# Patient Record
Sex: Female | Born: 1985 | Race: Asian | Hispanic: No | Marital: Single | Smoking: Never smoker
Health system: Southern US, Community
[De-identification: ages and names within clinical notes are randomized; demographics above are authoritative.]

## PROBLEM LIST (undated history)

## (undated) DIAGNOSIS — S62102A Fracture of unspecified carpal bone, left wrist, initial encounter for closed fracture: Secondary | ICD-10-CM

## (undated) DIAGNOSIS — K219 Gastro-esophageal reflux disease without esophagitis: Secondary | ICD-10-CM

## (undated) HISTORY — DX: Fracture of unspecified carpal bone, left wrist, initial encounter for closed fracture: S62.102A

## (undated) HISTORY — PX: UPPER GI ENDOSCOPY: SHX6162

---
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
## 2016-03-19 ENCOUNTER — Ambulatory Visit (INDEPENDENT_AMBULATORY_CARE_PROVIDER_SITE_OTHER): Payer: 59

## 2016-03-19 ENCOUNTER — Ambulatory Visit (INDEPENDENT_AMBULATORY_CARE_PROVIDER_SITE_OTHER): Payer: 59 | Admitting: Physician Assistant

## 2016-03-19 VITALS — BP 140/88 | HR 68 | Temp 98.2°F | Resp 16 | Ht 63.5 in | Wt 166.2 lb

## 2016-03-19 DIAGNOSIS — Z6828 Body mass index (BMI) 28.0-28.9, adult: Secondary | ICD-10-CM | POA: Insufficient documentation

## 2016-03-19 DIAGNOSIS — R071 Chest pain on breathing: Secondary | ICD-10-CM

## 2016-03-19 MED ORDER — CYCLOBENZAPRINE HCL 10 MG PO TABS: 10.0000 mg | ORAL_TABLET | Freq: Three times a day (TID) | ORAL | Status: DC | PRN

## 2016-03-19 MED ORDER — MELOXICAM 15 MG PO TABS: 15.0000 mg | ORAL_TABLET | Freq: Every day | ORAL | Status: DC

## 2016-03-19 NOTE — Patient Instructions (Signed)
     IF you received an x-ray today, you will receive an invoice from [facility]. Please contact [facility] at [phone] with questions or concerns regarding your invoice.   IF you received labwork today, you will receive an invoice from [facility]. Please contact [facility] at [phone] with questions or concerns regarding your invoice.   Our billing staff will not be able to assist you with questions regarding bills from these companies.  You will be contacted with the lab results as soon as they are available. The fastest way to get your results is to activate your My Chart account. Instructions are located on the last page of this paperwork. If you have not heard from us regarding the results in 2 weeks, please contact this office.      
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
## 2017-01-25 ENCOUNTER — Other Ambulatory Visit: Payer: Self-pay | Admitting: Gastroenterology

## 2017-01-25 DIAGNOSIS — R634 Abnormal weight loss: Secondary | ICD-10-CM

## 2017-01-25 DIAGNOSIS — R1013 Epigastric pain: Secondary | ICD-10-CM

## 2017-01-27 ENCOUNTER — Ambulatory Visit
Admission: RE | Admit: 2017-01-27 | Discharge: 2017-01-27 | Disposition: A | Discharge status: Home / Self Care | Payer: 59 | Source: Ambulatory Visit | Attending: Gastroenterology | Admitting: Gastroenterology

## 2017-01-27 DIAGNOSIS — R634 Abnormal weight loss: Secondary | ICD-10-CM

## 2017-01-27 DIAGNOSIS — R1013 Epigastric pain: Secondary | ICD-10-CM

## 2017-04-06 ENCOUNTER — Other Ambulatory Visit: Payer: Self-pay | Admitting: Gastroenterology

## 2017-04-06 DIAGNOSIS — R1084 Generalized abdominal pain: Secondary | ICD-10-CM

## 2017-04-06 DIAGNOSIS — R634 Abnormal weight loss: Secondary | ICD-10-CM

## 2017-04-12 ENCOUNTER — Ambulatory Visit
Admission: RE | Admit: 2017-04-12 | Discharge: 2017-04-12 | Disposition: A | Discharge status: Home / Self Care | Payer: 59 | Source: Ambulatory Visit | Attending: Gastroenterology | Admitting: Gastroenterology

## 2017-04-12 DIAGNOSIS — R1084 Generalized abdominal pain: Secondary | ICD-10-CM

## 2017-04-12 DIAGNOSIS — R634 Abnormal weight loss: Secondary | ICD-10-CM

## 2017-04-12 MED ORDER — IOPAMIDOL (ISOVUE-300) INJECTION 61%
100.0000 mL | Freq: Once | INTRAVENOUS | Status: AC | PRN
  Administered 2017-04-12: 100 mL via INTRAVENOUS

## 2018-02-08 ENCOUNTER — Other Ambulatory Visit: Payer: Self-pay | Admitting: Gastroenterology

## 2018-02-08 DIAGNOSIS — K824 Cholesterolosis of gallbladder: Secondary | ICD-10-CM

## 2018-02-18 ENCOUNTER — Ambulatory Visit
Admission: RE | Admit: 2018-02-18 | Discharge: 2018-02-18 | Disposition: A | Discharge status: Home / Self Care | Payer: 59 | Source: Ambulatory Visit | Attending: Gastroenterology | Admitting: Gastroenterology

## 2018-02-18 DIAGNOSIS — K824 Cholesterolosis of gallbladder: Secondary | ICD-10-CM

## 2018-12-07 IMAGING — US US ABDOMEN LIMITED
1 series · 14 of 25 positions shown · non-contrast
Comparison: CT scan of April 12, 2017.  Ultrasound January 27, 2017.

CLINICAL DATA: Gallbladder polyp.

EXAM:
ULTRASOUND ABDOMEN LIMITED RIGHT UPPER QUADRANT

[Series 1: us abdomen limited · 0.23mm/px · 14 of 49 slices shown]
[im 1/49]
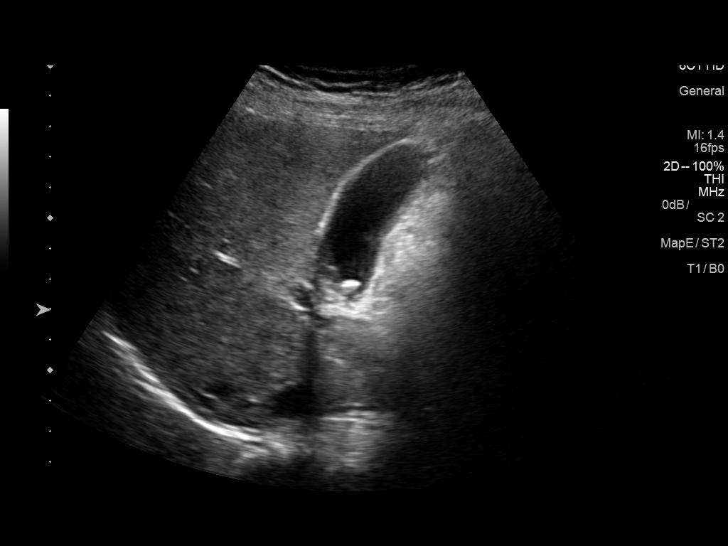
[im 5/49]
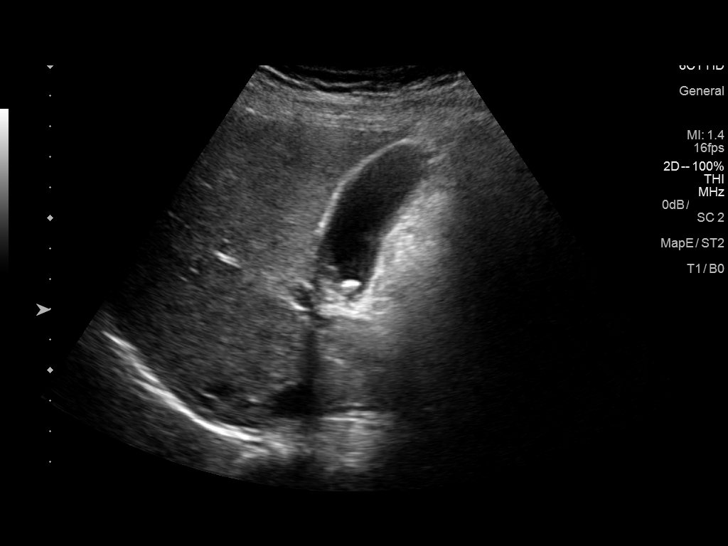
[im 9/49]
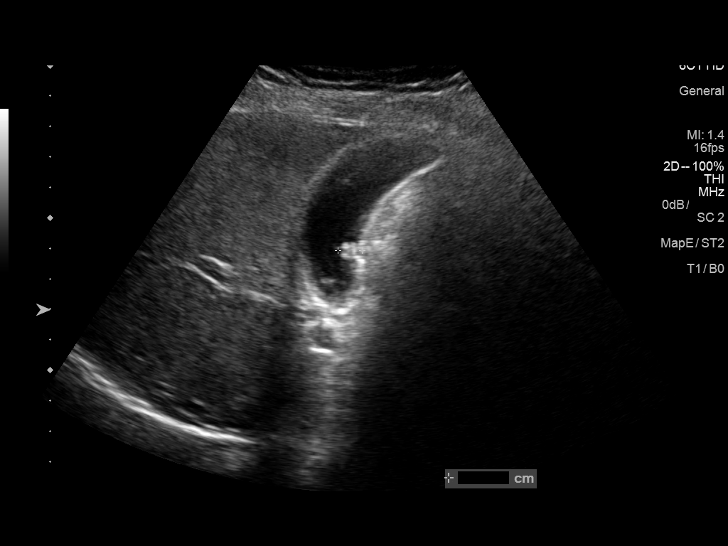
[im 13/49]
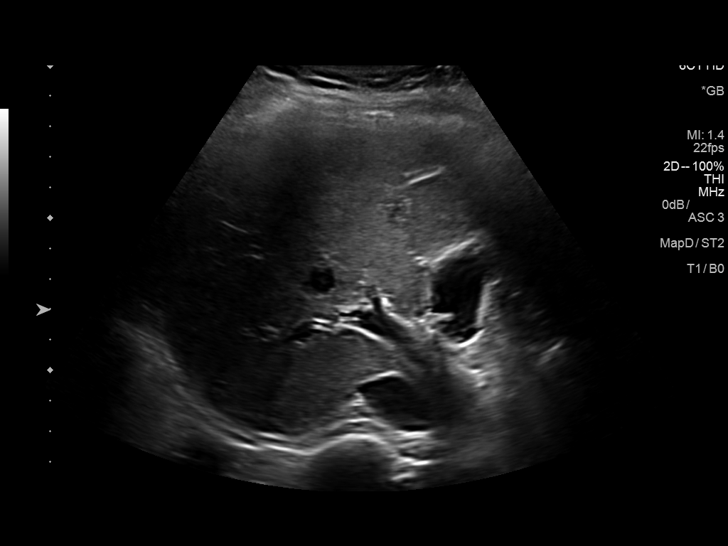
[im 17/49]
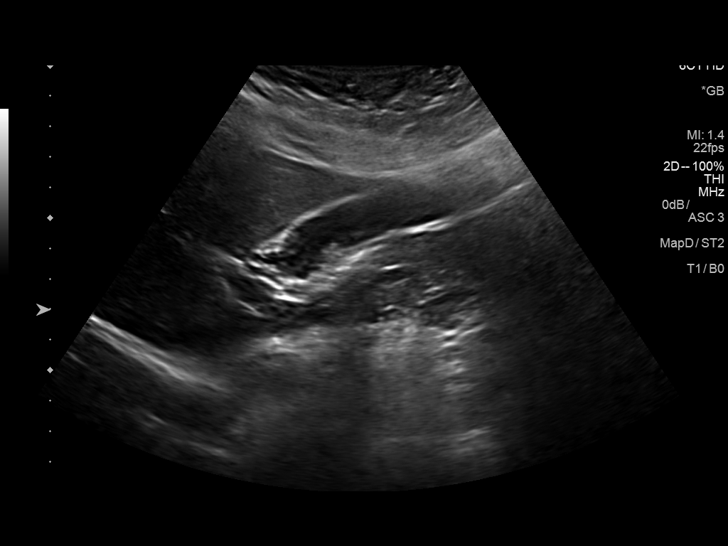
[im 19/49]
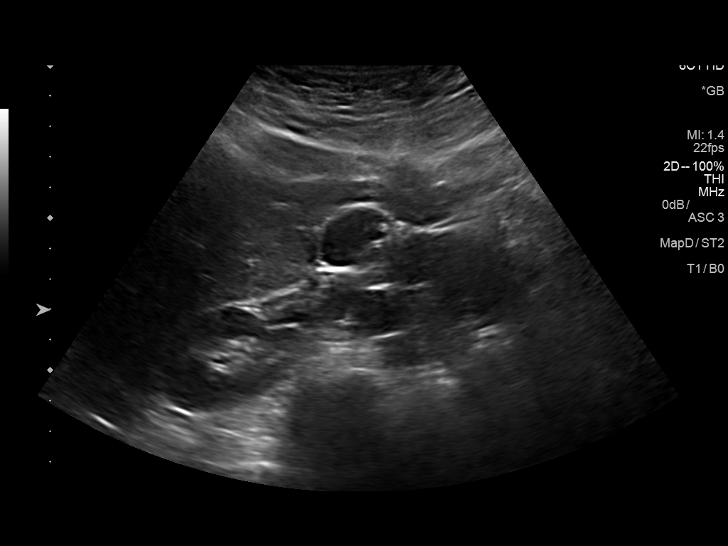
[im 23/49]
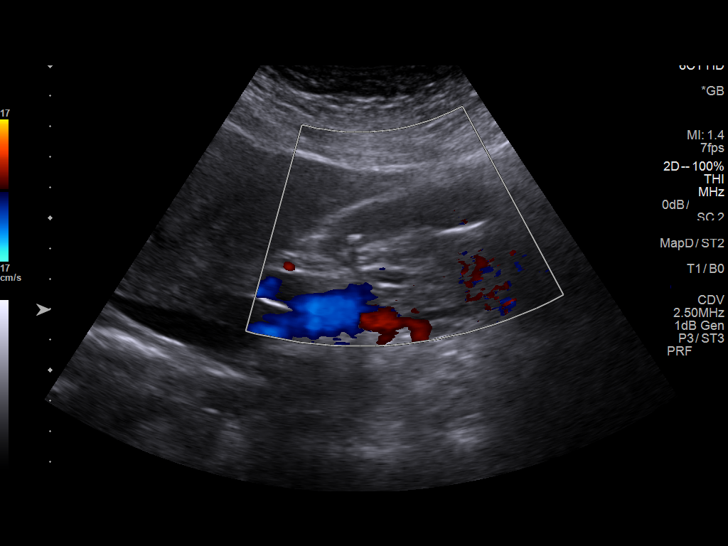
[im 27/49]
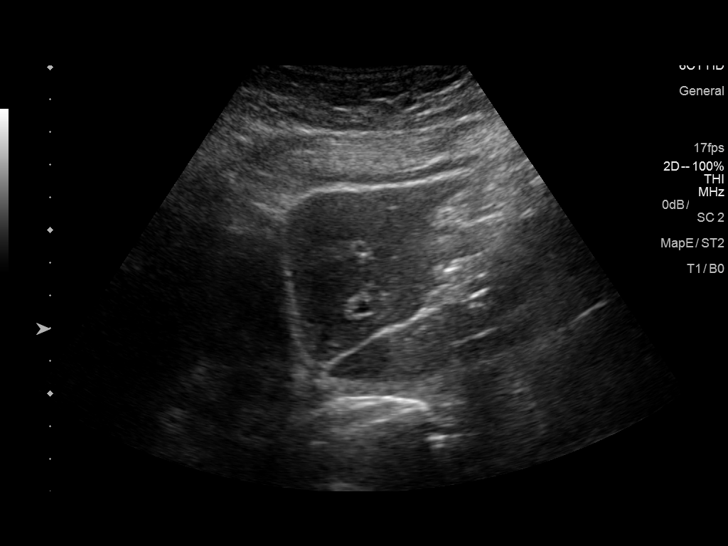
[im 31/49]
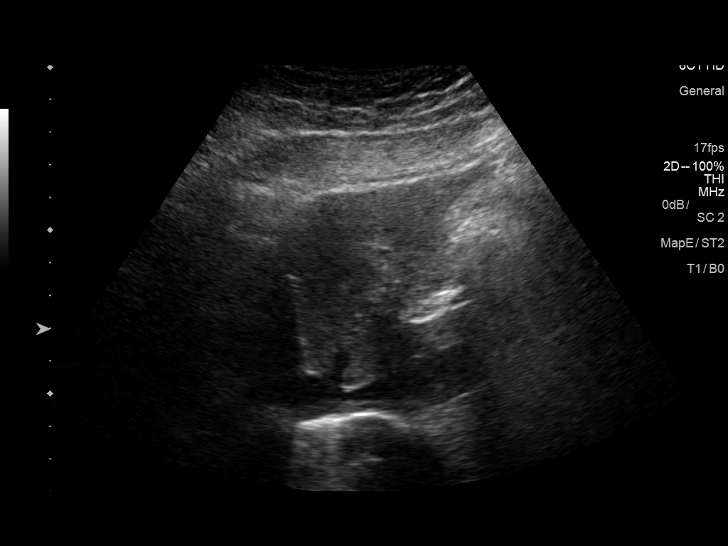
[im 33/49]
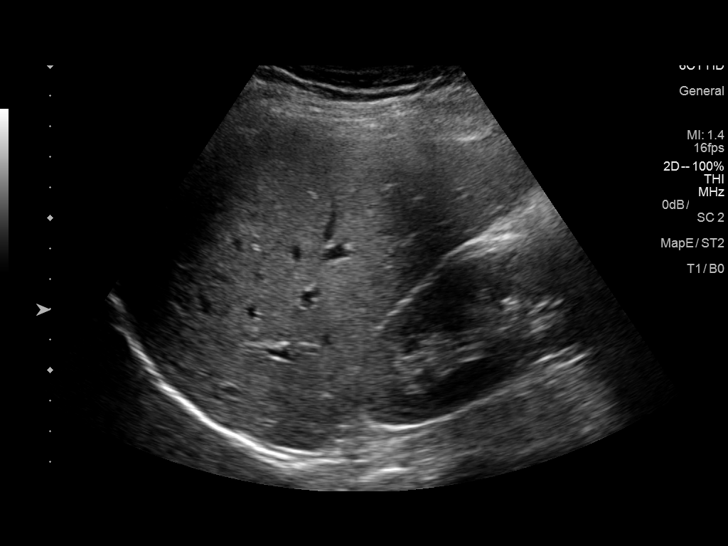
[im 37/49]
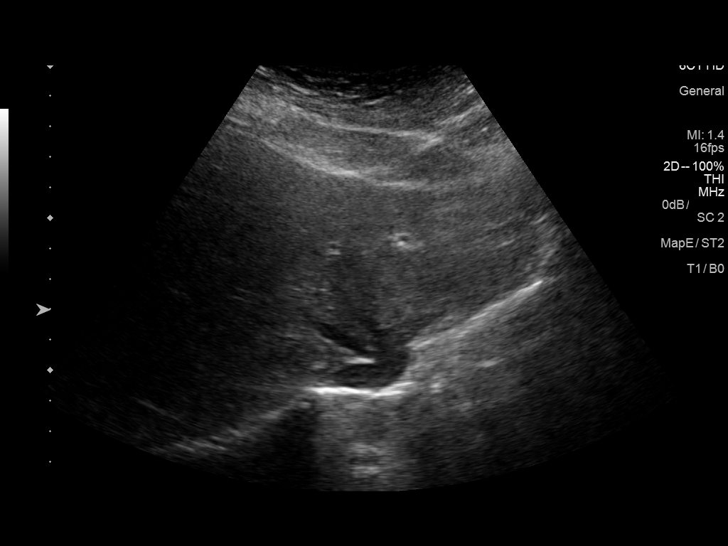
[im 41/49]
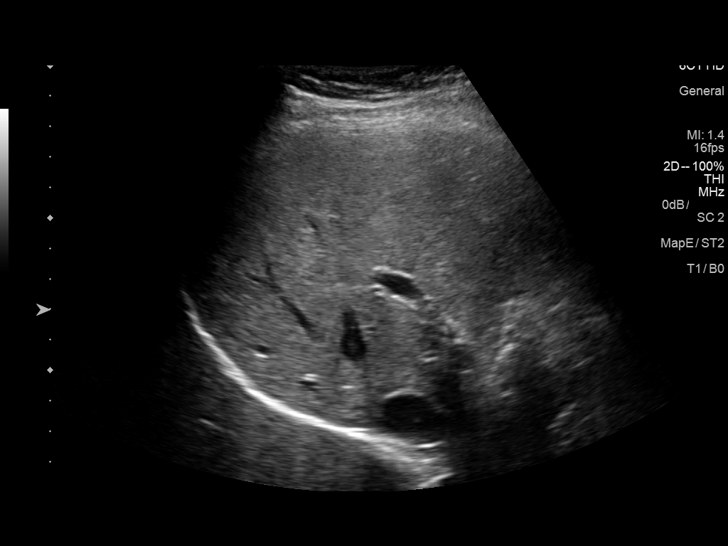
[im 45/49]
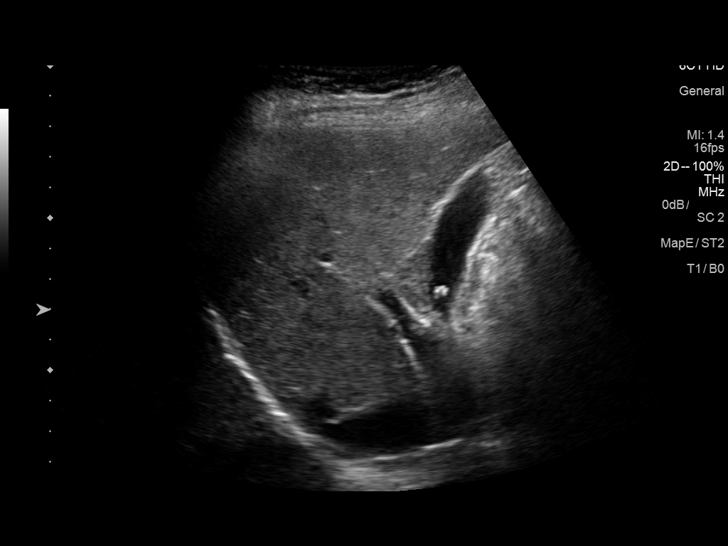
[im 49/49]
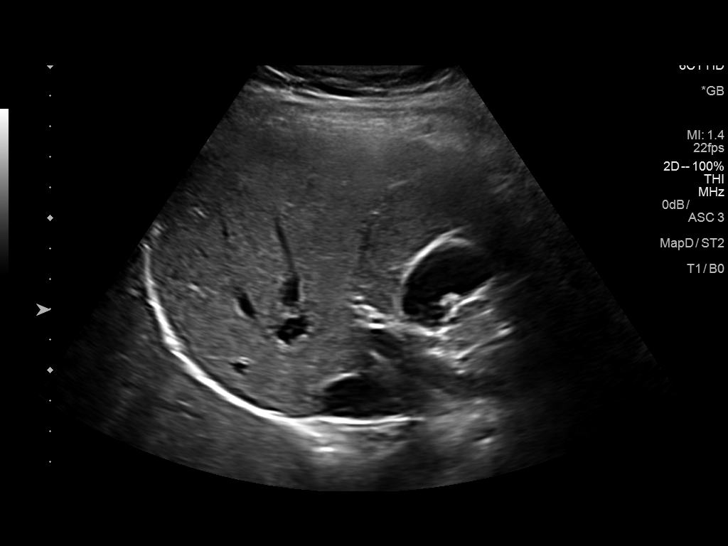

[14 of 25 positions shown; findings below may reference images not displayed]

FINDINGS: Gallbladder:

Multiple gallstones are noted with the largest measuring 7 mm. No
gallbladder wall thickening or pericholecystic fluid is noted. No
sonographic Murphy's sign is noted. 8 mm gallbladder polyp is noted
which is not significantly changed compared to prior exam.

Common bile duct:

Diameter: 5.5 mm which is within normal limits.

Liver:

No focal lesion identified. Within normal limits in parenchymal
echogenicity. Portal vein is patent on color Doppler imaging with
normal direction of blood flow towards the liver.
IMPRESSION: Cholelithiasis without evidence of cholecystitis.

8 mm gallbladder polyp is noted which is not significantly changed
compared to prior exam. Follow-up ultrasound in 1 year is
recommended to ensure stability.

## 2019-10-30 ENCOUNTER — Other Ambulatory Visit: Payer: Self-pay | Admitting: Gastroenterology

## 2019-10-30 DIAGNOSIS — R634 Abnormal weight loss: Secondary | ICD-10-CM

## 2019-10-30 DIAGNOSIS — R1013 Epigastric pain: Secondary | ICD-10-CM

## 2019-11-03 ENCOUNTER — Ambulatory Visit
Admission: RE | Admit: 2019-11-03 | Discharge: 2019-11-03 | Disposition: A | Discharge status: Home / Self Care | Payer: 59 | Source: Ambulatory Visit | Attending: Gastroenterology | Admitting: Gastroenterology

## 2019-11-03 DIAGNOSIS — R1013 Epigastric pain: Secondary | ICD-10-CM

## 2019-11-03 DIAGNOSIS — R634 Abnormal weight loss: Secondary | ICD-10-CM

## 2019-12-11 ENCOUNTER — Ambulatory Visit: Payer: Self-pay | Admitting: General Surgery

## 2019-12-12 NOTE — Pre-Procedure Instructions (Signed)
[id number] - [place], [place] - [address] AT [facility] OF [address] [place] [address] Phone: [phone] Fax: [phone]    Your procedure is scheduled on Tues., December 19, 2019 from 8:45AM-9:45AM  Report to [facility] Entrance at 6:45AM  Call this number if you have problems the morning of surgery:  [phone]   Remember:  Do not eat or drink after midnight on March 29th    Take these medicines the morning of surgery with A SIP OF WATER: NONE  As of today, stop taking all Aspirin (unless instructed by your doctor) and Other Aspirin containing products, Vitamins, Fish oils, and Herbal medications. Also stop all NSAIDS i.e. Advil, Ibuprofen, Motrin, Aleve, Anaprox, Naproxen, BC, Goody Powders, and all Supplements.  No Smoking of any kind, Tobacco, or Alcohol products 24 hours prior to your procedure. If you use a Cpap at night, you may bring all equipment for your overnight stay.    Special instructions:   [facility]- Preparing For Surgery  Before surgery, you can play an important role. Because skin is not sterile, your skin needs to be as free of germs as possible. You can reduce the number of germs on your skin by washing with CHG (chlorahexidine gluconate) Soap before surgery.  CHG is an antiseptic cleaner which kills germs and bonds with the skin to continue killing germs even after washing.    Please do not use if you have an allergy to CHG or antibacterial soaps. If your skin becomes reddened/irritated stop using the CHG.  Do not shave (including legs and underarms) for at least 48 hours prior to first CHG shower. It is OK to shave your face.  Please follow these instructions carefully.   1. Shower the NIGHT BEFORE SURGERY and the MORNING OF SURGERY with CHG.   2. If you chose to wash your hair, wash your hair first as usual with your normal shampoo.  3. After you shampoo, rinse your hair and body thoroughly  to remove the shampoo.  4. Use CHG as you would any other liquid soap. You can apply CHG directly to the skin and wash gently with a scrungie or a clean washcloth.   5. Apply the CHG Soap to your body ONLY FROM THE NECK DOWN.  Do not use on open wounds or open sores. Avoid contact with your eyes, ears, mouth and genitals (private parts). Wash Face and genitals (private parts)  with your normal soap.  6. Wash thoroughly, paying special attention to the area where your surgery will be performed.  7. Thoroughly rinse your body with warm water from the neck down.  8. DO NOT shower/wash with your normal soap after using and rinsing off the CHG Soap.  9. Pat yourself dry with a CLEAN TOWEL.  10. Wear CLEAN PAJAMAS to bed the night before surgery, wear comfortable clothes the morning of surgery  11. Place CLEAN SHEETS on your bed the night of your first shower and DO NOT SLEEP WITH PETS.   Day of Surgery:           Remember to brush your teeth WITH YOUR REGULAR TOOTHPASTE.  Do not wear jewelry, make-up or nail polish.  Do not wear lotions, powders, or perfumes, or deodorant.  Do not shave 48 hours prior to surgery.    Do not bring valuables to the hospital.  [facility] is not responsible for any belongings or valuables.  Contacts, dentures  or bridgework may not be worn into surgery.    For patients admitted to the hospital, discharge time will be determined by your treatment team.  Patients discharged the day of surgery will not be allowed to drive home, and someone age 37 and over needs to stay with them for 24 hours.  Please wear clean clothes to the hospital/surgery center.     Please read over the following fact sheets that you were given.

## 2019-12-13 ENCOUNTER — Other Ambulatory Visit: Payer: Self-pay

## 2019-12-13 ENCOUNTER — Encounter (HOSPITAL_COMMUNITY)
Admission: RE | Admit: 2019-12-13 | Discharge: 2019-12-13 | Disposition: A | Discharge status: Home / Self Care | Payer: 59 | Source: Ambulatory Visit | Attending: General Surgery | Admitting: General Surgery

## 2019-12-13 ENCOUNTER — Encounter (HOSPITAL_COMMUNITY): Payer: Self-pay

## 2019-12-13 DIAGNOSIS — Z01812 Encounter for preprocedural laboratory examination: Secondary | ICD-10-CM | POA: Insufficient documentation

## 2019-12-13 HISTORY — DX: Gastro-esophageal reflux disease without esophagitis: K21.9

## 2019-12-13 LAB — CBC
HCT: 40.9 % (ref 36.0–46.0)
Hemoglobin: 13.1 g/dL (ref 12.0–15.0)
MCH: 29.9 pg (ref 26.0–34.0)
MCHC: 32 g/dL (ref 30.0–36.0)
MCV: 93.4 fL (ref 80.0–100.0)
Platelets: 279 10*3/uL (ref 150–400)
RBC: 4.38 MIL/uL (ref 3.87–5.11)
RDW: 11.9 % (ref 11.5–15.5)
WBC: 4.3 10*3/uL (ref 4.0–10.5)
nRBC: 0 % (ref 0.0–0.2)

## 2019-12-13 NOTE — Progress Notes (Signed)
PCP - Delbert Harness  Chest x-ray - n/a EKG - n/a  COVID TEST- 12-15-19   Anesthesia review: n/a  Patient denies shortness of breath, fever, cough and chest pain at PAT appointment   All instructions explained to the patient, with a verbal understanding of the material. Patient agrees to go over the instructions while at home for a better understanding. Patient also instructed to self quarantine after being tested for COVID-19. The opportunity to ask questions was provided.

## 2019-12-15 ENCOUNTER — Other Ambulatory Visit (HOSPITAL_COMMUNITY)
Admission: RE | Admit: 2019-12-15 | Discharge: 2019-12-15 | Disposition: A | Discharge status: Home / Self Care | Payer: 59 | Source: Ambulatory Visit | Attending: General Surgery | Admitting: General Surgery

## 2019-12-15 DIAGNOSIS — Z20822 Contact with and (suspected) exposure to covid-19: Secondary | ICD-10-CM | POA: Diagnosis not present

## 2019-12-15 DIAGNOSIS — Z01812 Encounter for preprocedural laboratory examination: Secondary | ICD-10-CM | POA: Insufficient documentation

## 2019-12-15 LAB — SARS CORONAVIRUS 2 (TAT 6-24 HRS): SARS Coronavirus 2: NEGATIVE

## 2019-12-19 ENCOUNTER — Other Ambulatory Visit: Payer: Self-pay

## 2019-12-19 ENCOUNTER — Ambulatory Visit (HOSPITAL_COMMUNITY): Payer: 59 | Admitting: Anesthesiology

## 2019-12-19 ENCOUNTER — Encounter (HOSPITAL_COMMUNITY): Payer: Self-pay | Admitting: General Surgery

## 2019-12-19 ENCOUNTER — Ambulatory Visit (HOSPITAL_COMMUNITY)
Admission: RE | Admit: 2019-12-19 | Discharge: 2019-12-19 | Disposition: A | Discharge status: Home / Self Care | Payer: 59 | Attending: General Surgery | Admitting: General Surgery

## 2019-12-19 ENCOUNTER — Encounter (HOSPITAL_COMMUNITY)
Admission: RE | Disposition: A | Discharge status: Home / Self Care | Payer: Self-pay | Source: Home / Self Care | Attending: General Surgery

## 2019-12-19 DIAGNOSIS — Z888 Allergy status to other drugs, medicaments and biological substances status: Secondary | ICD-10-CM | POA: Diagnosis not present

## 2019-12-19 DIAGNOSIS — Z8349 Family history of other endocrine, nutritional and metabolic diseases: Secondary | ICD-10-CM | POA: Insufficient documentation

## 2019-12-19 DIAGNOSIS — K824 Cholesterolosis of gallbladder: Secondary | ICD-10-CM | POA: Diagnosis present

## 2019-12-19 DIAGNOSIS — K219 Gastro-esophageal reflux disease without esophagitis: Secondary | ICD-10-CM | POA: Insufficient documentation

## 2019-12-19 DIAGNOSIS — K811 Chronic cholecystitis: Secondary | ICD-10-CM | POA: Insufficient documentation

## 2019-12-19 DIAGNOSIS — Z820 Family history of epilepsy and other diseases of the nervous system: Secondary | ICD-10-CM | POA: Diagnosis not present

## 2019-12-19 DIAGNOSIS — Z832 Family history of diseases of the blood and blood-forming organs and certain disorders involving the immune mechanism: Secondary | ICD-10-CM | POA: Insufficient documentation

## 2019-12-19 DIAGNOSIS — Z8371 Family history of colonic polyps: Secondary | ICD-10-CM | POA: Diagnosis not present

## 2019-12-19 HISTORY — PX: CHOLECYSTECTOMY: SHX55

## 2019-12-19 LAB — POCT PREGNANCY, URINE: Preg Test, Ur: NEGATIVE

## 2019-12-19 SURGERY — LAPAROSCOPIC CHOLECYSTECTOMY
Anesthesia: General

## 2019-12-19 MED ORDER — FENTANYL CITRATE (PF) 250 MCG/5ML IJ SOLN
INTRAMUSCULAR | Status: DC | PRN
  Administered 2019-12-19: 100 ug via INTRAVENOUS
  Administered 2019-12-19 (×3): 50 ug via INTRAVENOUS

## 2019-12-19 MED ORDER — 0.9 % SODIUM CHLORIDE (POUR BTL) OPTIME
TOPICAL | Status: DC | PRN
  Administered 2019-12-19: 1000 mL

## 2019-12-19 MED ORDER — ONDANSETRON HCL 4 MG/2ML IJ SOLN
INTRAMUSCULAR | Status: AC
  Filled 2019-12-19: qty 2

## 2019-12-19 MED ORDER — MIDAZOLAM HCL 2 MG/2ML IJ SOLN
INTRAMUSCULAR | Status: AC
  Filled 2019-12-19: qty 2

## 2019-12-19 MED ORDER — LIDOCAINE 2% (20 MG/ML) 5 ML SYRINGE
INTRAMUSCULAR | Status: DC | PRN
  Administered 2019-12-19: 30 mg via INTRAVENOUS

## 2019-12-19 MED ORDER — SODIUM CHLORIDE 0.9 % IR SOLN
Status: DC | PRN
  Administered 2019-12-19: 1000 mL

## 2019-12-19 MED ORDER — CHLORHEXIDINE GLUCONATE CLOTH 2 % EX PADS: 6.0000 | MEDICATED_PAD | Freq: Once | CUTANEOUS | Status: DC

## 2019-12-19 MED ORDER — BUPIVACAINE HCL 0.25 % IJ SOLN
INTRAMUSCULAR | Status: DC | PRN
  Administered 2019-12-19: 8 mL

## 2019-12-19 MED ORDER — BUPIVACAINE HCL (PF) 0.25 % IJ SOLN
INTRAMUSCULAR | Status: AC
  Filled 2019-12-19: qty 30

## 2019-12-19 MED ORDER — ROCURONIUM BROMIDE 10 MG/ML (PF) SYRINGE
PREFILLED_SYRINGE | INTRAVENOUS | Status: AC
  Filled 2019-12-19: qty 10

## 2019-12-19 MED ORDER — SCOPOLAMINE 1 MG/3DAYS TD PT72
MEDICATED_PATCH | TRANSDERMAL | Status: DC | PRN
  Administered 2019-12-19: 1 via TRANSDERMAL

## 2019-12-19 MED ORDER — OXYCODONE HCL 5 MG PO TABS
5.0000 mg | ORAL_TABLET | Freq: Once | ORAL | Status: AC | PRN
  Administered 2019-12-19: 5 mg via ORAL

## 2019-12-19 MED ORDER — CEFAZOLIN SODIUM-DEXTROSE 2-4 GM/100ML-% IV SOLN
2.0000 g | INTRAVENOUS | Status: AC
  Administered 2019-12-19: 2 g via INTRAVENOUS
  Filled 2019-12-19: qty 100

## 2019-12-19 MED ORDER — ACETAMINOPHEN 500 MG PO TABS
1000.0000 mg | ORAL_TABLET | ORAL | Status: AC
  Administered 2019-12-19: 1000 mg via ORAL
  Filled 2019-12-19: qty 2

## 2019-12-19 MED ORDER — PROPOFOL 10 MG/ML IV BOLUS
INTRAVENOUS | Status: AC
  Filled 2019-12-19: qty 20

## 2019-12-19 MED ORDER — DEXAMETHASONE SODIUM PHOSPHATE 10 MG/ML IJ SOLN
INTRAMUSCULAR | Status: DC | PRN
  Administered 2019-12-19: 10 mg via INTRAVENOUS

## 2019-12-19 MED ORDER — PHENYLEPHRINE 40 MCG/ML (10ML) SYRINGE FOR IV PUSH (FOR BLOOD PRESSURE SUPPORT)
PREFILLED_SYRINGE | INTRAVENOUS | Status: DC | PRN
  Administered 2019-12-19: 80 ug via INTRAVENOUS

## 2019-12-19 MED ORDER — LIDOCAINE 2% (20 MG/ML) 5 ML SYRINGE
INTRAMUSCULAR | Status: AC
  Filled 2019-12-19: qty 5

## 2019-12-19 MED ORDER — SCOPOLAMINE 1 MG/3DAYS TD PT72
MEDICATED_PATCH | TRANSDERMAL | Status: AC
  Filled 2019-12-19: qty 1

## 2019-12-19 MED ORDER — MIDAZOLAM HCL 5 MG/5ML IJ SOLN
INTRAMUSCULAR | Status: DC | PRN
  Administered 2019-12-19: 2 mg via INTRAVENOUS

## 2019-12-19 MED ORDER — PROPOFOL 10 MG/ML IV BOLUS
INTRAVENOUS | Status: DC | PRN
  Administered 2019-12-19: 130 mg via INTRAVENOUS

## 2019-12-19 MED ORDER — FENTANYL CITRATE (PF) 250 MCG/5ML IJ SOLN
INTRAMUSCULAR | Status: AC
  Filled 2019-12-19: qty 5

## 2019-12-19 MED ORDER — FENTANYL CITRATE (PF) 100 MCG/2ML IJ SOLN
INTRAMUSCULAR | Status: AC
  Filled 2019-12-19: qty 2

## 2019-12-19 MED ORDER — OXYCODONE HCL 5 MG/5ML PO SOLN: 5.0000 mg | Freq: Once | ORAL | Status: AC | PRN

## 2019-12-19 MED ORDER — TRAMADOL HCL 50 MG PO TABS: 50.0000 mg | ORAL_TABLET | Freq: Four times a day (QID) | ORAL | 0 refills | Status: AC | PRN

## 2019-12-19 MED ORDER — LACTATED RINGERS IV SOLN: INTRAVENOUS | Status: DC | PRN

## 2019-12-19 MED ORDER — ONDANSETRON HCL 4 MG/2ML IJ SOLN
INTRAMUSCULAR | Status: DC | PRN
  Administered 2019-12-19: 4 mg via INTRAVENOUS

## 2019-12-19 MED ORDER — ONDANSETRON HCL 4 MG/2ML IJ SOLN
4.0000 mg | Freq: Once | INTRAMUSCULAR | Status: AC | PRN
  Administered 2019-12-19: 4 mg via INTRAVENOUS

## 2019-12-19 MED ORDER — OXYCODONE HCL 5 MG PO TABS
ORAL_TABLET | ORAL | Status: AC
  Filled 2019-12-19: qty 1

## 2019-12-19 MED ORDER — FENTANYL CITRATE (PF) 100 MCG/2ML IJ SOLN
25.0000 ug | INTRAMUSCULAR | Status: DC | PRN
  Administered 2019-12-19 (×2): 50 ug via INTRAVENOUS

## 2019-12-19 MED ORDER — LACTATED RINGERS IV SOLN: INTRAVENOUS | Status: DC

## 2019-12-19 MED ORDER — PHENYLEPHRINE 40 MCG/ML (10ML) SYRINGE FOR IV PUSH (FOR BLOOD PRESSURE SUPPORT)
PREFILLED_SYRINGE | INTRAVENOUS | Status: AC
  Filled 2019-12-19: qty 10

## 2019-12-19 MED ORDER — CELECOXIB 200 MG PO CAPS
200.0000 mg | ORAL_CAPSULE | ORAL | Status: AC
  Administered 2019-12-19: 200 mg via ORAL
  Filled 2019-12-19: qty 1

## 2019-12-19 MED ORDER — SUGAMMADEX SODIUM 200 MG/2ML IV SOLN
INTRAVENOUS | Status: DC | PRN
  Administered 2019-12-19: 200 mg via INTRAVENOUS

## 2019-12-19 MED ORDER — ROCURONIUM BROMIDE 10 MG/ML (PF) SYRINGE
PREFILLED_SYRINGE | INTRAVENOUS | Status: DC | PRN
  Administered 2019-12-19: 50 mg via INTRAVENOUS

## 2019-12-19 SURGICAL SUPPLY — 40 items
CANISTER SUCT 3000ML PPV (MISCELLANEOUS) ×2 IMPLANT
CHLORAPREP W/TINT 26 (MISCELLANEOUS) ×2 IMPLANT
CLIP VESOLOCK MED LG 6/CT (CLIP) ×4 IMPLANT
COVER SURGICAL LIGHT HANDLE (MISCELLANEOUS) ×2 IMPLANT
COVER TRANSDUCER ULTRASND (DRAPES) ×2 IMPLANT
COVER WAND RF STERILE (DRAPES) IMPLANT
DEFOGGER SCOPE WARMER CLEARIFY (MISCELLANEOUS) IMPLANT
DERMABOND ADVANCED (GAUZE/BANDAGES/DRESSINGS) ×1
DERMABOND ADVANCED .7 DNX12 (GAUZE/BANDAGES/DRESSINGS) ×1 IMPLANT
ELECT REM PT RETURN 9FT ADLT (ELECTROSURGICAL) ×2
ELECTRODE REM PT RTRN 9FT ADLT (ELECTROSURGICAL) ×1 IMPLANT
GLOVE BIO SURGEON STRL SZ7.5 (GLOVE) ×2 IMPLANT
GLOVE BIOGEL PI IND STRL 7.5 (GLOVE) ×4 IMPLANT
GLOVE BIOGEL PI INDICATOR 7.5 (GLOVE) ×4
GLOVE ECLIPSE 7.5 STRL STRAW (GLOVE) ×4 IMPLANT
GOWN STRL REUS W/ TWL LRG LVL3 (GOWN DISPOSABLE) ×1 IMPLANT
GOWN STRL REUS W/ TWL XL LVL3 (GOWN DISPOSABLE) ×4 IMPLANT
GOWN STRL REUS W/TWL LRG LVL3 (GOWN DISPOSABLE) ×1
GOWN STRL REUS W/TWL XL LVL3 (GOWN DISPOSABLE) ×4
GRASPER SUT TROCAR 14GX15 (MISCELLANEOUS) ×2 IMPLANT
KIT BASIN OR (CUSTOM PROCEDURE TRAY) ×2 IMPLANT
KIT TURNOVER KIT B (KITS) ×2 IMPLANT
NEEDLE INSUFFLATION 14GA 120MM (NEEDLE) ×2 IMPLANT
NS IRRIG 1000ML POUR BTL (IV SOLUTION) ×2 IMPLANT
PAD ARMBOARD 7.5X6 YLW CONV (MISCELLANEOUS) ×2 IMPLANT
POUCH LAPAROSCOPIC INSTRUMENT (MISCELLANEOUS) ×2 IMPLANT
POUCH RETRIEVAL ECOSAC 10 (ENDOMECHANICALS) IMPLANT
POUCH RETRIEVAL ECOSAC 10MM (ENDOMECHANICALS)
SCISSORS LAP 5X35 DISP (ENDOMECHANICALS) ×2 IMPLANT
SET IRRIG TUBING LAPAROSCOPIC (IRRIGATION / IRRIGATOR) ×2 IMPLANT
SET TUBE SMOKE EVAC HIGH FLOW (TUBING) ×2 IMPLANT
SLEEVE ENDOPATH XCEL 5M (ENDOMECHANICALS) ×2 IMPLANT
SPECIMEN JAR SMALL (MISCELLANEOUS) ×2 IMPLANT
SUT MNCRL AB 4-0 PS2 18 (SUTURE) ×2 IMPLANT
TOWEL GREEN STERILE (TOWEL DISPOSABLE) ×2 IMPLANT
TOWEL GREEN STERILE FF (TOWEL DISPOSABLE) IMPLANT
TRAY LAPAROSCOPIC MC (CUSTOM PROCEDURE TRAY) ×2 IMPLANT
TROCAR XCEL NON-BLD 11X100MML (ENDOMECHANICALS) ×2 IMPLANT
TROCAR XCEL NON-BLD 5MMX100MML (ENDOMECHANICALS) ×2 IMPLANT
WATER STERILE IRR 1000ML POUR (IV SOLUTION) ×2 IMPLANT

## 2019-12-19 NOTE — H&P (Signed)
History of Present Illness Ralene Ok MD; 11/22/2019 2:59 PM) The patient is a 34 year old female who presents for evaluation of gall stones. Referred by: Dr. 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 Patient was being followed up with ultrasounds serially. The gallbladder polyp did appear to increase in size from 2018 at 6.7 mm, 2019 6.9 mm, in 2021 at 8.9 mm. I did review her radiological studies personally. Secondary to increase in size over the last year patient was referred for laparoscopic cholecystectomy. Discussed with the patient she appears to have had some right upper quadrant abdominal pain as well as right lower back pain could be related to her gallbladder polyp. She does have a history of reflux.  She's had no previous abdominal surgeries.    Past Surgical History Andreas Blower, [place]; 11/22/2019 2:33 PM) No pertinent past surgical history   Diagnostic Studies History Andreas Blower, [place]; 11/22/2019 2:33 PM) Colonoscopy  1-5 years ago Mammogram  never Pap Smear  never  Allergies Andreas Blower, [place]; 11/22/2019 2:34 PM) Prevacid *ULCER DRUGS/ANTISPASMODICS/ANTICHOLINERGICS*   Medication History Andreas Blower, [place]; 11/22/2019 2:34 PM) No Current Medications Medications Reconciled  Social History Andreas Blower, CMA; 11/22/2019 2:33 PM) Alcohol use  Moderate alcohol use. No caffeine use  No drug use  Tobacco use  Never smoker.  Family History Andreas Blower, [place]; 11/22/2019 2:33 PM) Bleeding disorder  Mother, Sister. Colon Polyps  Mother. Migraine Headache  Mother. Thyroid problems  Mother.  Pregnancy / Birth History Andreas Blower, [place]; 11/22/2019 2:33 PM) Age at menarche  86 years. Gravida  0 Irregular periods  Para  0  Other Problems Andreas Blower, [place]; 11/22/2019 2:33 PM) Cholelithiasis  Gastroesophageal Reflux Disease     Review of  Systems Ralene Ok MD; 11/22/2019 2:55 PM) General Not Present- Appetite Loss, Chills, Fatigue, Fever, Night Sweats, Weight Gain and Weight Loss. Skin Not Present- Change in Wart/Mole, Dryness, Hives, Jaundice, New Lesions, Non-Healing Wounds, Rash and Ulcer. HEENT Not Present- Earache, Hearing Loss, Hoarseness, Nose Bleed, Oral Ulcers, Ringing in the Ears, Seasonal Allergies, Sinus Pain, Sore Throat, Visual Disturbances, Wears glasses/contact lenses and Yellow Eyes. Respiratory Not Present- Bloody sputum, Chronic Cough, Difficulty Breathing, Snoring and Wheezing. Breast Not Present- Breast Mass, Breast Pain, Nipple Discharge and Skin Changes. Cardiovascular Not Present- Chest Pain, Difficulty Breathing Lying Down, Leg Cramps, Palpitations, Rapid Heart Rate, Shortness of Breath and Swelling of Extremities. Gastrointestinal Present- Change in Bowel Habits, Excessive gas, Indigestion and Nausea. Not Present- Abdominal Pain, Bloating, Bloody Stool, Chronic diarrhea, Constipation, Difficulty Swallowing, Gets full quickly at meals, Hemorrhoids, Rectal Pain and Vomiting. Female Genitourinary Not Present- Frequency, Nocturia, Painful Urination, Pelvic Pain and Urgency. Musculoskeletal Not Present- Back Pain, Joint Pain, Joint Stiffness, Muscle Pain, Muscle Weakness and Swelling of Extremities. Neurological Not Present- Decreased Memory, Fainting, Headaches, Numbness, Seizures, Tingling, Tremor, Trouble walking and Weakness. Psychiatric Not Present- Anxiety, Bipolar, Change in Sleep Pattern, Depression, Fearful and Frequent crying. Endocrine Not Present- Cold Intolerance, Excessive Hunger, Hair Changes, Heat Intolerance, Hot flashes and New Diabetes. Hematology Not Present- Blood Thinners, Easy Bruising, Excessive bleeding, Gland problems, HIV and Persistent Infections. All other systems negative  Vitals (Armen Ferguson CMA; 11/22/2019 2:34 PM) 11/22/2019 2:34 PM Weight: 129.13 lb Height: 65in Body  Surface Area: 1.64 m Body Mass Index: 21.49 kg/m  Temp.: 98.27F  Pulse: 83 (Regular)  P.OX: 95% (Room air) BP: 100/72 (Sitting, Left Arm, Standard)  Physical Exam Axel Filler MD; 11/22/2019 2:57 PM) The physical exam findings are as follows: Note:Constitutional: No acute distress, conversant, appears stated age  Eyes: Anicteric sclerae, moist conjunctiva, no lid lag  Neck: No thyromegaly, trachea midline, no cervical lymphadenopathy  Lungs: Clear to auscultation biilaterally, normal respiratory effot  Cardiovascular: regular rate & rhythm, no murmurs, no peripheal edema, pedal pulses 2+  GI: Soft, no masses or hepatosplenomegaly, non-tender to palpation  MSK: Normal gait, no clubbing cyanosis, edema  Skin: No rashes, palpation reveals normal skin turgor  Psychiatric: Appropriate judgment and insight, oriented to person, place, and time    Assessment & Plan Axel Filler MD; 11/22/2019 2:59 PM) GALLBLADDER POLYP (K82.4) Impression: Patient is a 34 year old female with a increasing size of a gallbladder polyp from 6.7 mm to 8.9 mm. Patient may be having symptoms secondary to gallbladder polyp. 1. Will proceed with laparoscopic cholecystectomy secondary to gallbladder polyp 2. Risks and benefits were discussed with the patient to generally include, but not limited to: infection, bleeding, possible need for post op ERCP, damage to the bile ducts, bile leak, and possible need for further surgery. Alternatives were offered and described. All questions were answered and the patient voiced understanding of the procedure and wishes to proceed at this point with a laparoscopic cholecystectomy  I reviewed the patient's external notes from the referring physicians as well as consulting physician team. Each of the radiologic studies and lab studies were independently reviewed and interpreted. I discussed the results of the above studies and how they relate to the  patient's surgical problems.

## 2019-12-19 NOTE — Transfer of Care (Signed)
Immediate Anesthesia Transfer of Care Note  Patient: Heather Reyes  Procedure(s) Performed: LAPAROSCOPIC CHOLECYSTECTOMY (N/A )  Patient Location: PACU  Anesthesia Type:General  Level of Consciousness: awake, alert  and oriented  Airway & Oxygen Therapy: Patient Spontanous Breathing  Post-op Assessment: Report given to RN, Post -op Vital signs reviewed and stable and Patient moving all extremities X 4  Post vital signs: Reviewed and stable  Last Vitals:  Vitals Value Taken Time  BP    Temp    Pulse 75 12/19/19 0824  Resp 19 12/19/19 0824  SpO2 99 % 12/19/19 0824  Vitals shown include unvalidated device data.  Last Pain:  Vitals:   12/19/19 0656  TempSrc:   PainSc: 0-No pain      Patients Stated Pain Goal: 3 (12/19/19 1848)  Complications: No apparent anesthesia complications

## 2019-12-19 NOTE — Op Note (Signed)
12/19/2019  8:07 AM  PATIENT:  Heather Reyes  34 y.o. female  PRE-OPERATIVE DIAGNOSIS:  GALLSTONE POLYP  POST-OPERATIVE DIAGNOSIS:  GALLSTONE POLYP  PROCEDURE:  Procedure(s): LAPAROSCOPIC CHOLECYSTECTOMY (N/A)  SURGEON:  Surgeon(s) and Role:    * Axel Filler, MD - Primary  ASSISTANTS: Myrtie Soman, RNFA   ANESTHESIA:   local and general  EBL:  minimal   BLOOD ADMINISTERED:none  DRAINS: none   LOCAL MEDICATIONS USED:  BUPIVICAINE   SPECIMEN:  Source of Specimen:  gallbladder  DISPOSITION OF SPECIMEN:  PATHOLOGY  COUNTS:  YES  TOURNIQUET:  * No tourniquets in log *  DICTATION: .Dragon Dictation  EBL: <5cc   Complications: none   Counts: reported as correct x 2   Findings:chronically inflamed gallbladder  Indications for procedure: Pt is a 77F with RUQ pain and seen to have a polyp with an increase in size.   Secondary tot symptoms and increase in polyp pt decided to have her gallbladder removed.  Details of the procedure: The patient was taken to the operating and placed in the supine position with bilateral SCDs in place. A time out was called and all facts were verified. A pneumoperitoneum was obtained via A Veress needle technique to a pressure of 72mm of mercury. A 7mm trochar was then placed in the right upper quadrant under visualization, and there were no injuries to any abdominal organs. A 11 mm port was then placed in the umbilical region after infiltrating with local anesthesia under direct visualization. A second epigastric port was placed under direct visualization.   The gallbladder was identified and retracted, the peritoneum was then sharply dissected from the gallbladder and this dissection was carried down to Calot's triangle. The cystic duct was identified and dissected circumferentially and seen going into the gallbladder 360.  The cystic artery was dissected away from the surrounding tissues.   The critical angle was obtained.   2  hemoclips were placed proximally one distally and the cystic duct transected. The cystic artery was identified and 3 clips placed proximally and one distally and transected. We then proceeded to remove the gallbladder off the hepatic fossa with Bovie cautery. A retrieval bag was then placed in the abdomen and gallbladder placed in the bag. The hepatic fossa was then reexamined and hemostasis was achieved with Bovie cautery and was excellent at this portion of the case. The subhepatic fossa and perihepatic fossa was then irrigated until the effluent was clear. The specimen bag and specimen were removed from the abdominal cavity.  The 11 mm trocar fascia was reapproximated with the Endo Close #1 Vicryl x1. The pneumoperitoneum was evacuated and all trochars removed under direct visulalization. The skin was then closed with 4-0 Monocryl and the skin dressed with Dermabond. The patient was awaken from general anesthesia and taken to the recovery room in stable condition.    PLAN OF CARE: Discharge to home after PACU  PATIENT DISPOSITION:  PACU - hemodynamically stable.   Delay start of Pharmacological VTE agent (>24hrs) due to surgical blood loss or risk of bleeding: not applicable

## 2019-12-19 NOTE — Anesthesia Preprocedure Evaluation (Signed)
Anesthesia Evaluation  Patient identified by MRN, date of birth, ID band Patient awake    Reviewed: Allergy & Precautions, NPO status , Patient's Chart, lab work & pertinent test results  Airway Mallampati: II  TM Distance: >3 FB Neck ROM: Full    Dental  (+) Teeth Intact, Dental Advisory Given   Pulmonary    breath sounds clear to auscultation       Cardiovascular  Rhythm:Regular Rate:Normal     Neuro/Psych    GI/Hepatic   Endo/Other    Renal/GU      Musculoskeletal   Abdominal   Peds  Hematology   Anesthesia Other Findings   Reproductive/Obstetrics                             Anesthesia Physical Anesthesia Plan  ASA: I  Anesthesia Plan: General   Post-op Pain Management:    Induction: Intravenous  PONV Risk Score and Plan: Ondansetron and Dexamethasone  Airway Management Planned: Oral ETT  Additional Equipment:   Intra-op Plan:   Post-operative Plan: Extubation in OR  Informed Consent: I have reviewed the patients History and Physical, chart, labs and discussed the procedure including the risks, benefits and alternatives for the proposed anesthesia with the patient or authorized representative who has indicated his/her understanding and acceptance.     Dental advisory given  Plan Discussed with: CRNA and Anesthesiologist  Anesthesia Plan Comments:         Anesthesia Quick Evaluation  

## 2019-12-19 NOTE — Anesthesia Procedure Notes (Signed)
Procedure Name: Intubation Date/Time: 12/19/2019 7:40 AM Performed by: Nils Pyle, CRNA Pre-anesthesia Checklist: Patient identified, Emergency Drugs available, Suction available and Patient being monitored Patient Re-evaluated:Patient Re-evaluated prior to induction Oxygen Delivery Method: Circle System Utilized Preoxygenation: Pre-oxygenation with 100% oxygen Induction Type: IV induction Ventilation: Mask ventilation without difficulty Laryngoscope Size: Miller and 2 Grade View: Grade I Tube type: Oral Tube size: 7.0 mm Number of attempts: 1 Airway Equipment and Method: Stylet and Oral airway Placement Confirmation: ETT inserted through vocal cords under direct vision,  positive ETCO2 and breath sounds checked- equal and bilateral Secured at: 21 cm Tube secured with: Tape Dental Injury: Teeth and Oropharynx as per pre-operative assessment

## 2019-12-19 NOTE — Discharge Instructions (Signed)
[facility] ______[facility] °LAPAROSCOPIC SURGERY: POST OP INSTRUCTIONS °Always review your discharge instruction sheet given to you by the facility where your surgery was performed. °IF YOU HAVE DISABILITY OR FAMILY LEAVE FORMS, YOU MUST BRING THEM TO THE OFFICE FOR PROCESSING.   °DO NOT GIVE THEM TO YOUR DOCTOR. ° °1. A prescription for pain medication may be given to you upon discharge.  Take your pain medication as prescribed, if needed.  If narcotic pain medicine is not needed, then you may take acetaminophen (Tylenol) or ibuprofen (Advil) as needed. °2. Take your usually prescribed medications unless otherwise directed. °3. If you need a refill on your pain medication, please contact your pharmacy.  They will contact our office to request authorization. Prescriptions will not be filled after 5pm or on week-ends. °4. You should follow a light diet the first few days after arrival home, such as soup and crackers, etc.  Be sure to include lots of fluids daily. °5. Most patients will experience some swelling and bruising in the area of the incisions.  Ice packs will help.  Swelling and bruising can take several days to resolve.  °6. It is common to experience some constipation if taking pain medication after surgery.  Increasing fluid intake and taking a stool softener (such as Colace) will usually help or prevent this problem from occurring.  A mild laxative (Milk of Magnesia or Miralax) should be taken according to package instructions if there are no bowel movements after 48 hours. °7. Unless discharge instructions indicate otherwise, you may remove your bandages 24-48 hours after surgery, and you may shower at that time.  You may have steri-strips (small skin tapes) in place directly over the incision.  These strips should be left on the skin for 7-10 days.  If your surgeon used skin glue on the incision, you may shower in 24 hours.  The glue will flake off over the next 2-3 weeks.  Any sutures or  staples will be removed at the office during your follow-up visit. °8. ACTIVITIES:  You may resume regular (light) daily activities beginning the next day--such as daily self-care, walking, climbing stairs--gradually increasing activities as tolerated.  You may have sexual intercourse when it is comfortable.  Refrain from any heavy lifting or straining until approved by your doctor. °a. You may drive when you are no longer taking prescription pain medication, you can comfortably wear a seatbelt, and you can safely maneuver your car and apply brakes. °b. RETURN TO WORK:  __________________________________________________________ °9. You should see your doctor in the office for a follow-up appointment approximately 2-3 weeks after your surgery.  Make sure that you call for this appointment within a day or two after you arrive home to insure a convenient appointment time. °10. OTHER INSTRUCTIONS: __________________________________________________________________________________________________________________________ __________________________________________________________________________________________________________________________ °WHEN TO CALL YOUR DOCTOR: °1. Fever over 101.0 °2. Inability to urinate °3. Continued bleeding from incision. °4. Increased pain, redness, or drainage from the incision. °5. Increasing abdominal pain ° °The clinic staff is available to answer your questions during regular business hours.  Please don’t hesitate to call and ask to speak to one of the nurses for clinical concerns.  If you have a medical emergency, go to the nearest emergency room or call 911.  A surgeon from [facility] is always on call at the hospital. °[address], [address], [place], [place]  [address] ? [address] [address], [place], [place]   [address] °[phone] ? [phone] ? FAX [phone] °Web site:   [email or web address] °

## 2019-12-19 NOTE — Anesthesia Postprocedure Evaluation (Signed)
Anesthesia Post Note  Patient: Heather Reyes  Procedure(s) Performed: LAPAROSCOPIC CHOLECYSTECTOMY (N/A )     Patient location during evaluation: PACU Anesthesia Type: General Level of consciousness: awake and alert Pain management: pain level controlled Vital Signs Assessment: post-procedure vital signs reviewed and stable Respiratory status: spontaneous breathing, nonlabored ventilation, respiratory function stable and patient connected to nasal cannula oxygen Cardiovascular status: blood pressure returned to baseline and stable Postop Assessment: no apparent nausea or vomiting Anesthetic complications: no    Last Vitals:  Vitals:   12/19/19 0840 12/19/19 0855  BP: 103/68 101/63  Pulse: 72 62  Resp: 19 14  Temp:  36.6 C  SpO2: 95% 100%    Last Pain:  Vitals:   12/19/19 0855  TempSrc:   PainSc: Asleep                 Adaira Centola COKER

## 2019-12-20 LAB — SURGICAL PATHOLOGY

## 2019-12-28 ENCOUNTER — Ambulatory Visit: Payer: 59 | Attending: Internal Medicine

## 2019-12-28 DIAGNOSIS — Z23 Encounter for immunization: Secondary | ICD-10-CM

## 2019-12-28 NOTE — Progress Notes (Signed)
   Covid-19 Vaccination Clinic  Name:  Heather Reyes    MRN: [id number] DOB: March 25, 1986  12/28/2019  [name] was observed post Covid-19 immunization for 15 minutes without incident. She was provided with Vaccine Information Sheet and instruction to access the V-Safe system.   [name] was instructed to call 911 with any severe reactions post vaccine: Marland Kitchen Difficulty breathing  . Swelling of face and throat  . A fast heartbeat  . A bad rash all over body  . Dizziness and weakness   Immunizations Administered    Name Date Dose VIS Date Route   Pfizer COVID-19 Vaccine 12/28/2019  4:31 PM 0.3 mL 09/01/2019 Intramuscular   Manufacturer: [employer], [employer]   Lot: [id number]   NDC: [address]-9

## 2020-01-22 ENCOUNTER — Ambulatory Visit: Payer: 59 | Attending: Internal Medicine

## 2020-01-22 DIAGNOSIS — Z23 Encounter for immunization: Secondary | ICD-10-CM

## 2020-01-22 NOTE — Progress Notes (Signed)
   Covid-19 Vaccination Clinic  Name:  MINA BABULA    MRN: [id number] DOB: December 11, 1985  01/22/2020  Ms. Tippett was observed post Covid-19 immunization for 15 minutes without incident. She was provided with Vaccine Information Sheet and instruction to access the V-Safe system.   Ms. Kitzmiller was instructed to call 911 with any severe reactions post vaccine: Marland Kitchen Difficulty breathing  . Swelling of face and throat  . A fast heartbeat  . A bad rash all over body  . Dizziness and weakness   Immunizations Administered    Name Date Dose VIS Date Route   Pfizer COVID-19 Vaccine 01/22/2020  4:38 PM 0.3 mL 11/15/2018 Intramuscular   Manufacturer: [employer], [employer]   Lot: [id number]   NDC: [address]-8

## 2020-10-07 ENCOUNTER — Other Ambulatory Visit: Payer: Self-pay

## 2020-10-07 ENCOUNTER — Encounter (HOSPITAL_BASED_OUTPATIENT_CLINIC_OR_DEPARTMENT_OTHER): Payer: Self-pay | Admitting: Orthopaedic Surgery

## 2020-10-09 NOTE — Progress Notes (Signed)

## 2020-10-10 ENCOUNTER — Other Ambulatory Visit (HOSPITAL_COMMUNITY): Payer: 59

## 2020-10-11 ENCOUNTER — Other Ambulatory Visit (HOSPITAL_COMMUNITY)
Admission: RE | Admit: 2020-10-11 | Discharge: 2020-10-11 | Disposition: A | Discharge status: Home / Self Care | Payer: 59 | Source: Ambulatory Visit | Attending: Orthopaedic Surgery | Admitting: Orthopaedic Surgery

## 2020-10-11 DIAGNOSIS — Z01812 Encounter for preprocedural laboratory examination: Secondary | ICD-10-CM | POA: Diagnosis present

## 2020-10-11 DIAGNOSIS — Z20822 Contact with and (suspected) exposure to covid-19: Secondary | ICD-10-CM | POA: Insufficient documentation

## 2020-10-11 LAB — SARS CORONAVIRUS 2 (TAT 6-24 HRS): SARS Coronavirus 2: NEGATIVE

## 2020-10-14 ENCOUNTER — Ambulatory Visit (HOSPITAL_BASED_OUTPATIENT_CLINIC_OR_DEPARTMENT_OTHER)
Admission: RE | Admit: 2020-10-14 | Discharge: 2020-10-14 | Disposition: A | Discharge status: Home / Self Care | Payer: 59 | Attending: Orthopaedic Surgery | Admitting: Orthopaedic Surgery

## 2020-10-14 ENCOUNTER — Encounter (HOSPITAL_BASED_OUTPATIENT_CLINIC_OR_DEPARTMENT_OTHER)
Admission: RE | Disposition: A | Discharge status: Home / Self Care | Payer: Self-pay | Source: Home / Self Care | Attending: Orthopaedic Surgery

## 2020-10-14 ENCOUNTER — Encounter (HOSPITAL_BASED_OUTPATIENT_CLINIC_OR_DEPARTMENT_OTHER): Payer: Self-pay | Admitting: Orthopaedic Surgery

## 2020-10-14 ENCOUNTER — Ambulatory Visit (HOSPITAL_BASED_OUTPATIENT_CLINIC_OR_DEPARTMENT_OTHER): Payer: 59 | Admitting: Anesthesiology

## 2020-10-14 ENCOUNTER — Other Ambulatory Visit: Payer: Self-pay

## 2020-10-14 DIAGNOSIS — Z888 Allergy status to other drugs, medicaments and biological substances status: Secondary | ICD-10-CM | POA: Insufficient documentation

## 2020-10-14 DIAGNOSIS — Z800 Family history of malignant neoplasm of digestive organs: Secondary | ICD-10-CM | POA: Insufficient documentation

## 2020-10-14 DIAGNOSIS — M67432 Ganglion, left wrist: Secondary | ICD-10-CM | POA: Diagnosis present

## 2020-10-14 HISTORY — PX: GANGLION CYST EXCISION: SHX1691

## 2020-10-14 LAB — POCT PREGNANCY, URINE: Preg Test, Ur: NEGATIVE

## 2020-10-14 SURGERY — EXCISION, GANGLION CYST, WRIST
Anesthesia: Monitor Anesthesia Care | Site: Wrist | Laterality: Left

## 2020-10-14 MED ORDER — POVIDONE-IODINE 7.5 % EX SOLN: Freq: Once | CUTANEOUS | Status: DC

## 2020-10-14 MED ORDER — ONDANSETRON HCL 4 MG/2ML IJ SOLN
INTRAMUSCULAR | Status: AC
  Filled 2020-10-14: qty 2

## 2020-10-14 MED ORDER — OXYCODONE HCL 5 MG PO TABS: 5.0000 mg | ORAL_TABLET | Freq: Once | ORAL | Status: DC | PRN

## 2020-10-14 MED ORDER — PROMETHAZINE HCL 25 MG/ML IJ SOLN: 6.2500 mg | INTRAMUSCULAR | Status: DC | PRN

## 2020-10-14 MED ORDER — HYDROCODONE-ACETAMINOPHEN 5-325 MG PO TABS
1.0000 | ORAL_TABLET | Freq: Four times a day (QID) | ORAL | 0 refills | Status: DC | PRN
Start: 2020-10-14 — End: 2020-10-14

## 2020-10-14 MED ORDER — PROPOFOL 500 MG/50ML IV EMUL
INTRAVENOUS | Status: AC
  Filled 2020-10-14: qty 50

## 2020-10-14 MED ORDER — GLYCOPYRROLATE PF 0.2 MG/ML IJ SOSY
PREFILLED_SYRINGE | INTRAMUSCULAR | Status: AC
  Filled 2020-10-14: qty 1

## 2020-10-14 MED ORDER — LIDOCAINE 2% (20 MG/ML) 5 ML SYRINGE
INTRAMUSCULAR | Status: AC
  Filled 2020-10-14: qty 5

## 2020-10-14 MED ORDER — BUPIVACAINE HCL (PF) 0.25 % IJ SOLN
INTRAMUSCULAR | Status: AC
  Filled 2020-10-14: qty 30

## 2020-10-14 MED ORDER — AMISULPRIDE (ANTIEMETIC) 5 MG/2ML IV SOLN: 10.0000 mg | Freq: Once | INTRAVENOUS | Status: DC | PRN

## 2020-10-14 MED ORDER — SCOPOLAMINE 1 MG/3DAYS TD PT72
1.0000 | MEDICATED_PATCH | TRANSDERMAL | Status: DC
  Administered 2020-10-14: 1.5 mg via TRANSDERMAL

## 2020-10-14 MED ORDER — LIDOCAINE HCL (CARDIAC) PF 100 MG/5ML IV SOSY
PREFILLED_SYRINGE | INTRAVENOUS | Status: DC | PRN
  Administered 2020-10-14: 40 mg via INTRATRACHEAL

## 2020-10-14 MED ORDER — PROPOFOL 10 MG/ML IV BOLUS
INTRAVENOUS | Status: DC | PRN
  Administered 2020-10-14 (×2): 20 mg via INTRAVENOUS

## 2020-10-14 MED ORDER — CEFAZOLIN SODIUM-DEXTROSE 2-4 GM/100ML-% IV SOLN
2.0000 g | INTRAVENOUS | Status: AC
  Administered 2020-10-14: 2 g via INTRAVENOUS

## 2020-10-14 MED ORDER — CEFAZOLIN SODIUM-DEXTROSE 2-3 GM-%(50ML) IV SOLR
INTRAVENOUS | Status: DC | PRN
  Administered 2020-10-14: 2 g via INTRAVENOUS

## 2020-10-14 MED ORDER — PROPOFOL 500 MG/50ML IV EMUL
INTRAVENOUS | Status: DC | PRN
  Administered 2020-10-14: 75 ug/kg/min via INTRAVENOUS

## 2020-10-14 MED ORDER — ACETAMINOPHEN 500 MG PO TABS
1000.0000 mg | ORAL_TABLET | Freq: Once | ORAL | Status: AC
  Administered 2020-10-14: 1000 mg via ORAL

## 2020-10-14 MED ORDER — MIDAZOLAM HCL 2 MG/2ML IJ SOLN
2.0000 mg | Freq: Once | INTRAMUSCULAR | Status: AC
  Administered 2020-10-14: 2 mg via INTRAVENOUS

## 2020-10-14 MED ORDER — ONDANSETRON HCL 4 MG/2ML IJ SOLN
INTRAMUSCULAR | Status: DC | PRN
  Administered 2020-10-14: 4 mg via INTRAVENOUS

## 2020-10-14 MED ORDER — LACTATED RINGERS IV SOLN: INTRAVENOUS | Status: DC

## 2020-10-14 MED ORDER — FENTANYL CITRATE (PF) 100 MCG/2ML IJ SOLN
INTRAMUSCULAR | Status: AC
  Filled 2020-10-14: qty 2

## 2020-10-14 MED ORDER — ROPIVACAINE HCL 5 MG/ML IJ SOLN
INTRAMUSCULAR | Status: DC | PRN
  Administered 2020-10-14: 30 mL via PERINEURAL

## 2020-10-14 MED ORDER — MIDAZOLAM HCL 2 MG/2ML IJ SOLN
INTRAMUSCULAR | Status: AC
  Filled 2020-10-14: qty 2

## 2020-10-14 MED ORDER — FENTANYL CITRATE (PF) 100 MCG/2ML IJ SOLN: 25.0000 ug | INTRAMUSCULAR | Status: DC | PRN

## 2020-10-14 MED ORDER — DEXAMETHASONE SODIUM PHOSPHATE 10 MG/ML IJ SOLN
INTRAMUSCULAR | Status: AC
  Filled 2020-10-14: qty 1

## 2020-10-14 MED ORDER — ACETAMINOPHEN 500 MG PO TABS
ORAL_TABLET | ORAL | Status: AC
  Filled 2020-10-14: qty 2

## 2020-10-14 MED ORDER — SCOPOLAMINE 1 MG/3DAYS TD PT72
MEDICATED_PATCH | TRANSDERMAL | Status: AC
  Filled 2020-10-14: qty 1

## 2020-10-14 MED ORDER — OXYCODONE HCL 5 MG/5ML PO SOLN: 5.0000 mg | Freq: Once | ORAL | Status: DC | PRN

## 2020-10-14 MED ORDER — HYDROCODONE-ACETAMINOPHEN 5-325 MG PO TABS: 1.0000 | ORAL_TABLET | Freq: Four times a day (QID) | ORAL | 0 refills | Status: AC | PRN

## 2020-10-14 MED ORDER — FENTANYL CITRATE (PF) 100 MCG/2ML IJ SOLN
50.0000 ug | Freq: Once | INTRAMUSCULAR | Status: AC
  Administered 2020-10-14: 50 ug via INTRAVENOUS

## 2020-10-14 MED ORDER — CEFAZOLIN SODIUM-DEXTROSE 2-4 GM/100ML-% IV SOLN
INTRAVENOUS | Status: AC
  Filled 2020-10-14: qty 100

## 2020-10-14 SURGICAL SUPPLY — 49 items
BLADE SURG 15 STRL LF DISP TIS (BLADE) ×2 IMPLANT
BLADE SURG 15 STRL SS (BLADE) ×2
BNDG CONFORM 2 STRL LF (GAUZE/BANDAGES/DRESSINGS) IMPLANT
BNDG ELASTIC 3X5.8 VLCR STR LF (GAUZE/BANDAGES/DRESSINGS) ×2 IMPLANT
BNDG ESMARK 4X9 LF (GAUZE/BANDAGES/DRESSINGS) ×2 IMPLANT
BNDG GAUZE ELAST 4 BULKY (GAUZE/BANDAGES/DRESSINGS) ×2 IMPLANT
CANISTER SUCT 1200ML W/VALVE (MISCELLANEOUS) IMPLANT
CHLORAPREP W/TINT 26 (MISCELLANEOUS) ×2 IMPLANT
CORD BIPOLAR FORCEPS 12FT (ELECTRODE) ×2 IMPLANT
COVER BACK TABLE 60X90IN (DRAPES) ×2 IMPLANT
COVER WAND RF STERILE (DRAPES) IMPLANT
CUFF TOURN SGL QUICK 18X4 (TOURNIQUET CUFF) ×2 IMPLANT
DECANTER SPIKE VIAL GLASS SM (MISCELLANEOUS) IMPLANT
DRAPE EXTREMITY T 121X128X90 (DISPOSABLE) ×2 IMPLANT
DRAPE IMP U-DRAPE 54X76 (DRAPES) ×2 IMPLANT
DRAPE SURG 17X23 STRL (DRAPES) ×2 IMPLANT
GAUZE 4X4 16PLY RFD (DISPOSABLE) IMPLANT
GAUZE SPONGE 4X4 12PLY STRL (GAUZE/BANDAGES/DRESSINGS) ×2 IMPLANT
GAUZE XEROFORM 1X8 LF (GAUZE/BANDAGES/DRESSINGS) ×2 IMPLANT
GLOVE ECLIPSE 6.5 STRL STRAW (GLOVE) ×2 IMPLANT
GLOVE SRG 8 PF TXTR STRL LF DI (GLOVE) IMPLANT
GLOVE SURG SYN 7.5  E (GLOVE)
GLOVE SURG SYN 7.5 E (GLOVE) IMPLANT
GLOVE SURG UNDER POLY LF SZ7 (GLOVE) ×2 IMPLANT
GLOVE SURG UNDER POLY LF SZ8 (GLOVE)
GOWN STRL REUS W/ TWL LRG LVL3 (GOWN DISPOSABLE) ×2 IMPLANT
GOWN STRL REUS W/TWL LRG LVL3 (GOWN DISPOSABLE) ×2
NEEDLE HYPO 22GX1.5 SAFETY (NEEDLE) IMPLANT
NEEDLE HYPO 25X1 1.5 SAFETY (NEEDLE) ×2 IMPLANT
NS IRRIG 1000ML POUR BTL (IV SOLUTION) ×2 IMPLANT
PACK BASIN DAY SURGERY FS (CUSTOM PROCEDURE TRAY) ×2 IMPLANT
PADDING CAST ABS 3INX4YD NS (CAST SUPPLIES) ×1
PADDING CAST ABS 4INX4YD NS (CAST SUPPLIES)
PADDING CAST ABS COTTON 3X4 (CAST SUPPLIES) ×1 IMPLANT
PADDING CAST ABS COTTON 4X4 ST (CAST SUPPLIES) IMPLANT
SHEET MEDIUM DRAPE 40X70 STRL (DRAPES) ×2 IMPLANT
SLEEVE SCD COMPRESS KNEE MED (MISCELLANEOUS) IMPLANT
SLING ARM FOAM STRAP MED (SOFTGOODS) ×2 IMPLANT
SPLINT FIBERGLASS 3X35 (CAST SUPPLIES) ×2 IMPLANT
STOCKINETTE SYNTHETIC 3 UNSTER (CAST SUPPLIES) IMPLANT
STRIP CLOSURE SKIN 1/2X4 (GAUZE/BANDAGES/DRESSINGS) ×2 IMPLANT
SUCTION FRAZIER HANDLE 10FR (MISCELLANEOUS)
SUCTION TUBE FRAZIER 10FR DISP (MISCELLANEOUS) IMPLANT
SUT PROLENE 4 0 PS 2 18 (SUTURE) ×2 IMPLANT
SYR BULB EAR ULCER 3OZ GRN STR (SYRINGE) ×2 IMPLANT
SYR CONTROL 10ML LL (SYRINGE) ×2 IMPLANT
TOWEL GREEN STERILE FF (TOWEL DISPOSABLE) ×2 IMPLANT
TUBE CONNECTING 20X1/4 (TUBING) IMPLANT
UNDERPAD 30X36 HEAVY ABSORB (UNDERPADS AND DIAPERS) ×2 IMPLANT

## 2020-10-14 NOTE — Progress Notes (Signed)
Assisted Dr. Bass with left, ultrasound guided, supraclavicular block. Side rails up, monitors on throughout procedure. See vital signs in flow sheet. Tolerated Procedure well. 

## 2020-10-14 NOTE — Transfer of Care (Signed)
Immediate Anesthesia Transfer of Care Note  Patient: Heather Reyes  Procedure(s) Performed: Excision of left dorsal carpal ganglion (Left Wrist)  Patient Location: PACU  Anesthesia Type:MAC combined with regional for post-op pain  Level of Consciousness: awake, alert  and oriented  Airway & Oxygen Therapy: Patient Spontanous Breathing  Post-op Assessment: Report given to RN and Post -op Vital signs reviewed and stable  Post vital signs: Reviewed and stable  Last Vitals:  Vitals Value Taken Time  BP    Temp    Pulse    Resp    SpO2      Last Pain:  Vitals:   10/14/20 0956  TempSrc: Oral  PainSc: 0-No pain      Patients Stated Pain Goal: 4 (03/00/92 3300)  Complications: No complications documented.

## 2020-10-14 NOTE — H&P (Signed)
ORTHOPAEDIC H&P  PCP:  Macy Mis, MD  Chief Complaint: Left dorsal carpal ganglion  HPI: Heather Reyes is a 35 y.o. female who complains of left dorsal carpal ganglion.  Is been there for several years at this point.  It is waxed and waned in size and also caused her pain in the dorsum of the wrist with full wrist extension.  We discussed treatment options going forward at her visit in clinic she did wish to proceed forward with surgical excision of dorsal carpal ganglion.  She presents today for surgical management of her left wrist.  She denies any numbness or tingling.  She actually notes slight decrease in the size of the ganglion since her clinic appointment.  She has been n.p.o.  Past Medical History:  Diagnosis Date  . GERD (gastroesophageal reflux disease)   . Wrist fracture, left 2005   Past Surgical History:  Procedure Laterality Date  . CHOLECYSTECTOMY N/A 12/19/2019   Procedure: LAPAROSCOPIC CHOLECYSTECTOMY;  Surgeon: Axel Filler, MD;  Location: [facility] OR;  Service: General;  Laterality: N/A;  . UPPER GI ENDOSCOPY     Social History   Socioeconomic History  . Marital status: Single    Spouse name: n/a  . Number of children: 0  . Years of education: college  . Highest education level: Not on file  Occupational History  . Occupation: DNA Systems developer    CommentPublic house manager  Tobacco Use  . Smoking status: Never Smoker  . Smokeless tobacco: Never Used  Vaping Use  . Vaping Use: Never used  Substance and Sexual Activity  . Alcohol use: Yes    Alcohol/week: 0.0 - 1.0 standard drinks  . Drug use: No  . Sexual activity: Not on file  Other Topics Concern  . Not on file  Social History Narrative   Lives with her parents and brother, and her dog.   Social Determinants of Health   Financial Resource Strain: Not on file  Food Insecurity: Not on file  Transportation Needs: Not on file  Physical Activity: Not on file  Stress: Not on  file  Social Connections: Not on file   Family History  Problem Relation Age of Onset  . Stomach cancer Maternal Grandfather   . Pancreatic cancer Paternal Grandfather    Allergies  Allergen Reactions  . Lansoprazole Hives and Rash   Prior to Admission medications   Medication Sig Start Date End Date Taking? Authorizing Provider  Norethindrone Acet-Ethinyl Est (AUROVELA 1/20 PO) Take by mouth.   Yes [provider]  traMADol (ULTRAM) 50 MG tablet Take 1 tablet (50 mg total) by mouth every 6 (six) hours as needed. 12/19/19 12/18/20  Axel Filler, MD   No results found.  Positive ROS: All other systems have been reviewed and were otherwise negative with the exception of those mentioned in the HPI and as above.  Physical Exam: Constitutional: Healthy-appearing and normal body habitus who is in NAD. Ambulation normal.  Psychiatric: Normal affect. Oriented x3  Cardiovascular: Left radial pulse 2+ and capillary refill test normal. Edema none.  Musculoskeletal: Left hand no atrophy. Strength Left APB 5/5 and 1st DI 5/5.  Neurologic: Normal sensation to the ulnar nerve distribution, radial nerve distribution, and median nerve distribution.  Hand: Examination of the left upper extremity shows a grossly normal appearing left hand. There is no swelling, erythema or signs of infection. There is a well-defined, soft lesion over the dorsal aspect of the wrist. This is  directly overlying the scapholunate interval. It is mildly fluctuant and mildly tender. Patient has near full range of motion of the wrist including flexion, extension, pronation and supination compared to the contralateral side. She does have pain though with full wrist extension. She also has pain with Watson's maneuver. No palpable clunk is appreciated though. Full motion of the digits.  Assessment: Left dorsal carpal ganglion  Plan: Plan to proceed forward with excision of left dorsal carpal ganglion.  Risk,  benefits and alternatives of the procedure were once again discussed with patient.  These risks include but are not limited to infection, bleeding, damage to surrounding structures including blood vessels and nerves, pain, stiffness, recurrence and need for additional procedures.  Informed consent was obtained.  Patient's left hand was marked.  Plan for discharge home postoperatively and follow-up with me in 10 to 14 days.    Ernest Mallick, MD [phone]   10/14/2020 10:37 AM

## 2020-10-14 NOTE — Anesthesia Preprocedure Evaluation (Addendum)
Anesthesia Evaluation  Patient identified by MRN, date of birth, ID band Patient awake    Reviewed: Allergy & Precautions, H&P , NPO status , Patient's Chart, lab work & pertinent test results  Airway Mallampati: II  TM Distance: >3 FB Neck ROM: Full    Dental no notable dental hx.    Pulmonary neg pulmonary ROS,    Pulmonary exam normal breath sounds clear to auscultation       Cardiovascular negative cardio ROS Normal cardiovascular exam Rhythm:Regular Rate:Normal     Neuro/Psych negative neurological ROS  negative psych ROS   GI/Hepatic Neg liver ROS, GERD  Controlled,  Endo/Other  negative endocrine ROS  Renal/GU negative Renal ROS  negative genitourinary   Musculoskeletal negative musculoskeletal ROS (+)   Abdominal Normal abdominal exam  (+)   Peds negative pediatric ROS (+)  Hematology negative hematology ROS (+)   Anesthesia Other Findings   Reproductive/Obstetrics negative OB ROS                            Anesthesia Physical Anesthesia Plan  ASA: I  Anesthesia Plan: MAC and Regional   Post-op Pain Management:    Induction: Intravenous  PONV Risk Score and Plan: 2 and Midazolam, Scopolamine patch - Pre-op, Ondansetron, TIVA, Treatment may vary due to age or medical condition and Propofol infusion  Airway Management Planned: Natural Airway  Additional Equipment:   Intra-op Plan:   Post-operative Plan:   Informed Consent: I have reviewed the patients History and Physical, chart, labs and discussed the procedure including the risks, benefits and alternatives for the proposed anesthesia with the patient or authorized representative who has indicated his/her understanding and acceptance.     Dental advisory given  Plan Discussed with: CRNA and Anesthesiologist  Anesthesia Plan Comments: (GA/LMA for anesthesia plan given small size of cyst and non-bony involvenement.  However, Peripheral nerve block/MAC requested by surgeon Jeannie Fend. Norton Blizzard, MD  )     Anesthesia Quick Evaluation

## 2020-10-14 NOTE — Anesthesia Postprocedure Evaluation (Signed)
Anesthesia Post Note  Patient: Heather Reyes  Procedure(s) Performed: Excision of left dorsal carpal ganglion (Left Wrist)     Patient location during evaluation: PACU Anesthesia Type: MAC and Regional Level of consciousness: awake and alert Pain management: pain level controlled Vital Signs Assessment: post-procedure vital signs reviewed and stable Respiratory status: spontaneous breathing and respiratory function stable Cardiovascular status: stable Postop Assessment: no apparent nausea or vomiting Anesthetic complications: no   No complications documented.  Last Vitals:  Vitals:   10/14/20 1130 10/14/20 1213  BP: 118/78 114/79  Pulse: 68 66  Resp: 16 20  Temp: 36.6 C 36.6 C  SpO2: 100% 98%    Last Pain:  Vitals:   10/14/20 1213  TempSrc:   PainSc: 0-No pain                 Merlinda Frederick

## 2020-10-14 NOTE — Op Note (Signed)
PREOPERATIVE DIAGNOSIS: Left dorsal carpal ganglion  POSTOPERATIVE DIAGNOSIS: Same  ATTENDING PHYSICIAN: Maudry Mayhew. Jeannie Fend, III, MD who was present and scrubbed for the entire case   ASSISTANT SURGEON: None.   ANESTHESIA: Regional with MAC  SURGICAL PROCEDURES: Excision of left dorsal carpal ganglion  SURGICAL INDICATIONS: Patient is a 35 year old female who was previously seen and evaluated by me in clinic.  She had a several year history of left dorsal wrist mass as well as pain.  She was initially treated by my partner Dr. Delilah Shan where work-up was found to have a left dorsal carpal ganglion.  This was confirmed under a wrist MRI.  She noted continued pain with exercises especially with full dorsiflexion of the wrist.  We discussed treatment options going forward at her clinic visit and she did wish to proceed forward with surgical excision of the dorsal carpal ganglion and she presents today for that.  FINDING: There is a well-defined mucinous lesion on the dorsum of the wrist which was extending from the scapholunate interval.  This was excised in its entirety and sent to pathology as a specimen.  DESCRIPTION OF PROCEDURE: Patient was identified in the preop holding area where the risk benefits and alternatives of the procedure were once again discussed with the patient.  These risks include but are not limited to infection, bleeding, damage to surrounding structures including blood vessels and nerves, pain, stiffness, recurrence and need for additional procedures.  Informed consent was obtained that time the patient's left hand was marked with a surgical marking pen.  She then went underwent a left upper extremity plexus block by anesthesia.  She was then brought to the operative suite where time was performed identifying the correct patient operative site.  She was positioned supine on the operative table with her hand outstretched on a hand table.  A tourniquet was placed on the upper arm.   She was induced under MAC sedation.  Preoperative antibiotics were administered.  The left upper extremity was then prepped and draped in usual sterile fashion.  The limb was exsanguinated and the tourniquet was inflated.  A transverse incision was made on the dorsum of the wrist hidden within the wrist flexion creases.  Blunt dissection was carried down through the subcutaneous tissues.  The tendons of the second and fourth compartments were identified and mobilized with retractors and protected.  Just deep to the tendons, there was a well-defined cystic lesion which was filled with mucinous fluid.  Blunt dissection was performed surrounding the lesion.  8 stalk was identified and traced back to the wrist joint which was emanating from the scapholunate interval.  Complete excision of the lesion was performed it was sent to pathology as a specimen.  On examination it was consistent with a dorsal carpal ganglion.  A rondure was then used to gently debride the scapholunate interval and bipolar electrocautery was used to cauterize the stalk to help prevent recurrence.  At this point the wound was copiously irrigated with normal saline.  The skin was closed with running 4-0 subcuticular Prolene suture.  Steri-Strips, Xeroform, 4 x 4's and a well-padded volar slab splint was placed.  The tourniquet was released and the patient had return of brisk capillary refill to all of her digits.  She tolerated the procedure well and there were no complications.  ESTIMATED BLOOD LOSS: Less than 10 mL  TOURNIQUET TIME: 14 minutes  SPECIMENS: Left dorsal wrist mass  POSTOPERATIVE PLAN: The patient will be discharged home and seen back  in the office in approximately 10-12 days for wound check, suture  removal, and then be sent to a therapist for Splint fabrication, scar management, edema control and some range of motion exercises.  IMPLANTS: None

## 2020-10-14 NOTE — Anesthesia Procedure Notes (Signed)
Anesthesia Regional Block: Supraclavicular block   Pre-Anesthetic Checklist: ,, timeout performed, Correct Patient, Correct Site, Correct Laterality, Correct Procedure, Correct Position, site marked, Risks and benefits discussed,  Surgical consent,  Pre-op evaluation,  At surgeon's request and post-op pain management  Laterality: Left  Prep: chloraprep       Needles:  Injection technique: Single-shot  Needle Type: Echogenic Stimulator Needle     Needle Length: 9cm  Needle Gauge: 20     Additional Needles:   Narrative:  Start time: 10/14/2020 10:35 AM End time: 10/14/2020 10:40 AM Injection made incrementally with aspirations every 5 mL.  Performed by: Personally  Anesthesiologist: Mellody Dance, MD  Additional Notes: Functioning IV was confirmed and monitors applied.  A 26mm 22ga echogenic arrow stimulator was used. Sterile prep and drape,hand hygiene and sterile gloves were used.Ultrasound guidance: relevant anatomy identified, needle position confirmed, local anesthetic spread visualized around nerve(s)., vascular puncture avoided.  Image printed for medical record.  Negative aspiration and negative test dose prior to incremental administration of local anesthetic. The patient tolerated the procedure well.

## 2020-10-14 NOTE — Discharge Instructions (Signed)
Regional Anesthesia Blocks  1. Numbness or the inability to move the "blocked" extremity may last from 3-48 hours after placement. The length of time depends on the medication injected and your individual response to the medication. If the numbness is not going away after 48 hours, call your surgeon.  2. The extremity that is blocked will need to be protected until the numbness is gone and the  Strength has returned. Because you cannot feel it, you will need to take extra care to avoid injury. Because it may be weak, you may have difficulty moving it or using it. You may not know what position it is in without looking at it while the block is in effect.  3. For blocks in the legs and feet, returning to weight bearing and walking needs to be done carefully. You will need to wait until the numbness is entirely gone and the strength has returned. You should be able to move your leg and foot normally before you try and bear weight or walk. You will need someone to be with you when you first try to ensure you do not fall and possibly risk injury.  4. Bruising and tenderness at the needle site are common side effects and will resolve in a few days.  5. Persistent numbness or new problems with movement should be communicated to the surgeon or the [facility] [phone] [facility] [phone]).   Post Anesthesia Home Care Instructions  Activity: Get plenty of rest for the remainder of the day. A responsible individual must stay with you for 24 hours following the procedure.  For the next 24 hours, DO NOT: -Drive a car -Advertising copywriter -Drink alcoholic beverages -Take any medication unless instructed by your physician -Make any legal decisions or sign important papers.  Meals: Start with liquid foods such as gelatin or soup. Progress to regular foods as tolerated. Avoid greasy, spicy, heavy foods. If nausea and/or vomiting occur, drink only clear liquids until the  nausea and/or vomiting subsides. Call your physician if vomiting continues.  Special Instructions/Symptoms: Your throat may feel dry or sore from the anesthesia or the breathing tube placed in your throat during surgery. If this causes discomfort, gargle with warm salt water. The discomfort should disappear within 24 hours.  If you had a scopolamine patch placed behind your ear for the management of post- operative nausea and/or vomiting:  1. The medication in the patch is effective for 72 hours, after which it should be removed.  Wrap patch in a tissue and discard in the trash. Wash hands thoroughly with soap and water. 2. You may remove the patch earlier than 72 hours if you experience unpleasant side effects which may include dry mouth, dizziness or visual disturbances. 3. Avoid touching the patch. Wash your hands with soap and water after contact with the patch.       Discharge Instructions  - Keep dressings in place. Do not remove them. - The dressings must stay dry - Take all medication as prescribed. Transition to over the counter pain medication as your pain improves - Keep the hand elevated over the next 48-72 hours to help with pain and swelling - Move all digits not restricted by the dressings regularly to prevent stiffness - Please call to schedule a follow up appointment with Dr. Roney Mans and therapy at [phone] for 10-14 days following surgery - Your pain medication have been sent digitally to your pharmacy

## 2020-10-15 ENCOUNTER — Encounter (HOSPITAL_BASED_OUTPATIENT_CLINIC_OR_DEPARTMENT_OTHER): Payer: Self-pay | Admitting: Orthopaedic Surgery

## 2020-10-15 LAB — SURGICAL PATHOLOGY

## 2021-09-22 IMAGING — US US ABDOMEN COMPLETE
2 series · 13 of 25 positions shown · non-contrast
Comparison: January 27, 2017 and February 18, 2018

CLINICAL DATA: Follow-up gallbladder polyp.

EXAM:
ABDOMEN ULTRASOUND COMPLETE

[Series 1: us abdomen complete · 0.20mm/px · 12 of 83 slices shown (1 of 2)]
[im 1/83]
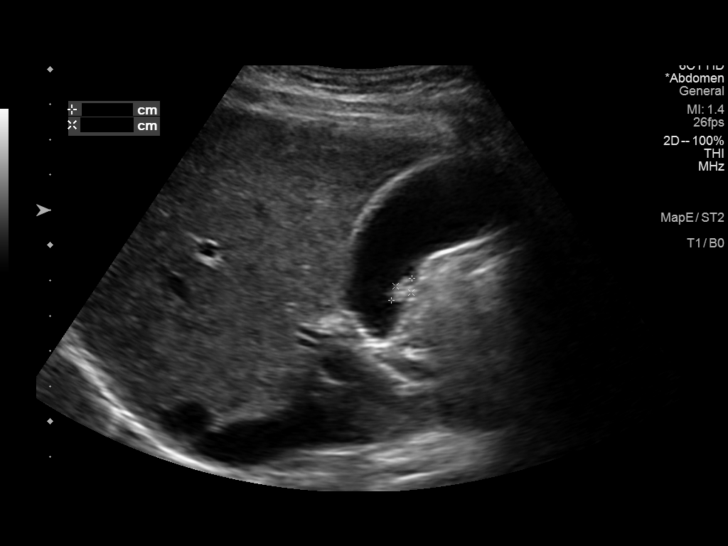
[im 8/83]
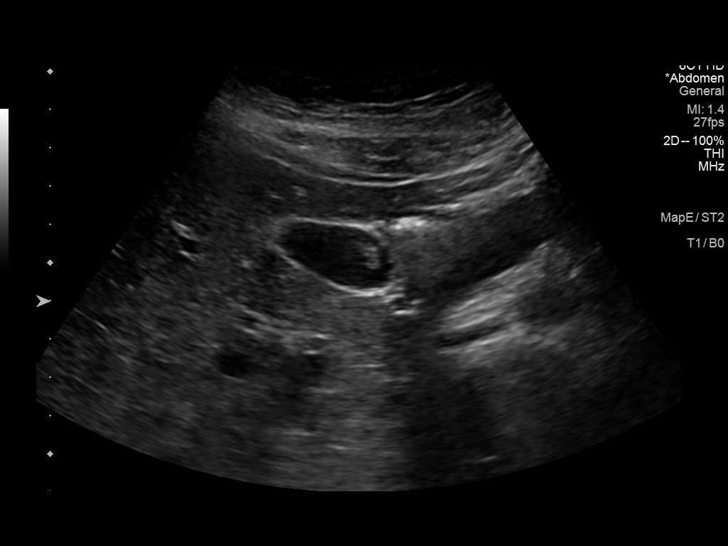
[im 15/83]
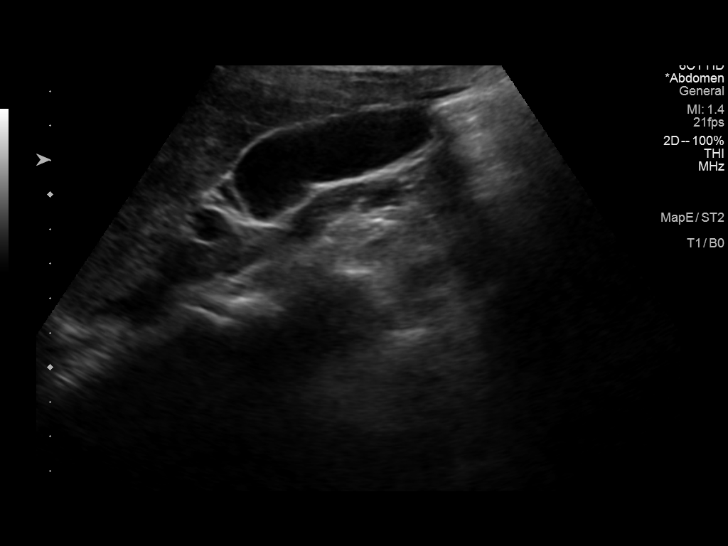
[im 22/83]
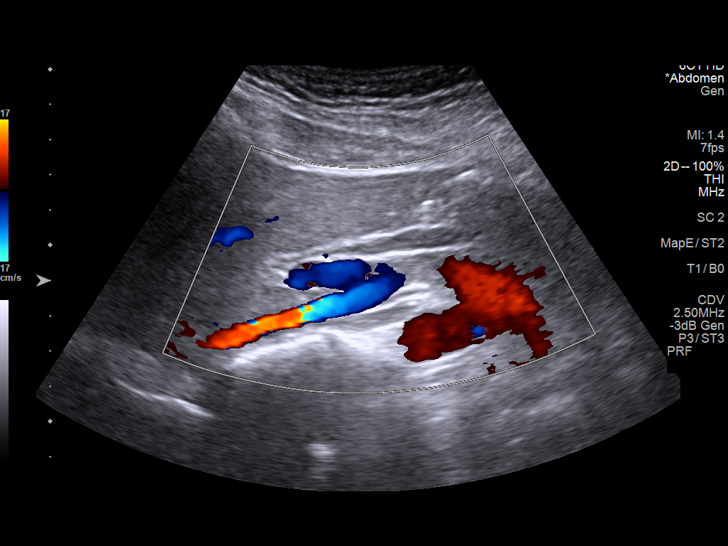
[im 29/83]
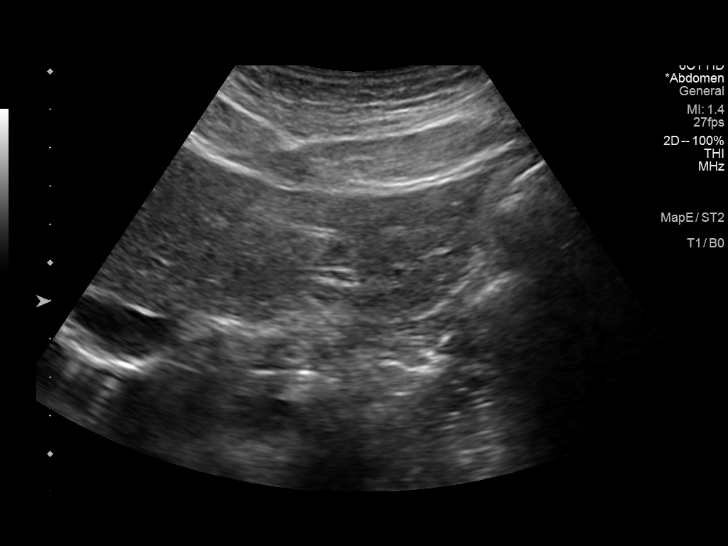
[im 36/83]
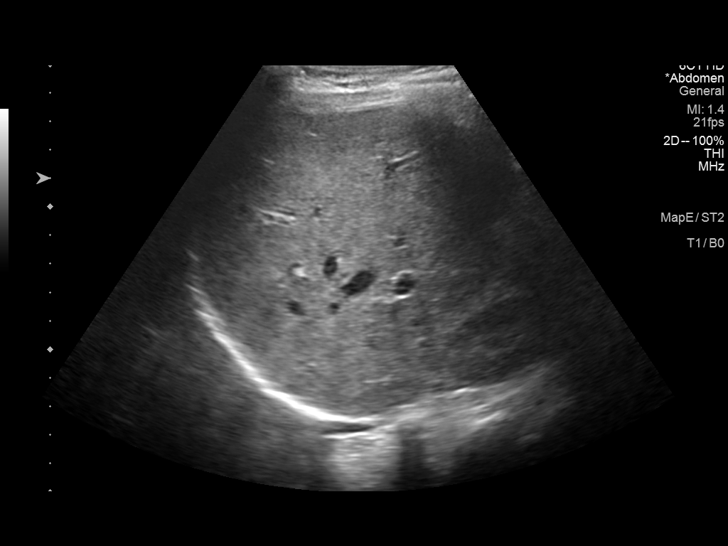
[im 43/83]
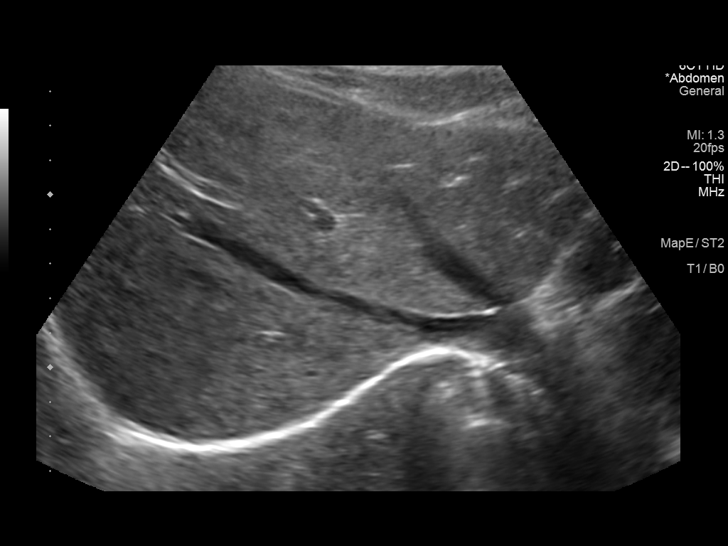
[im 50/83]
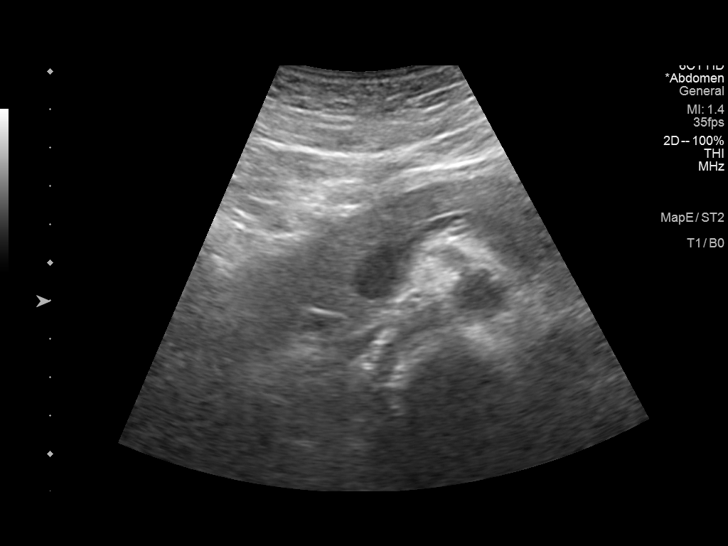
[im 58/83]
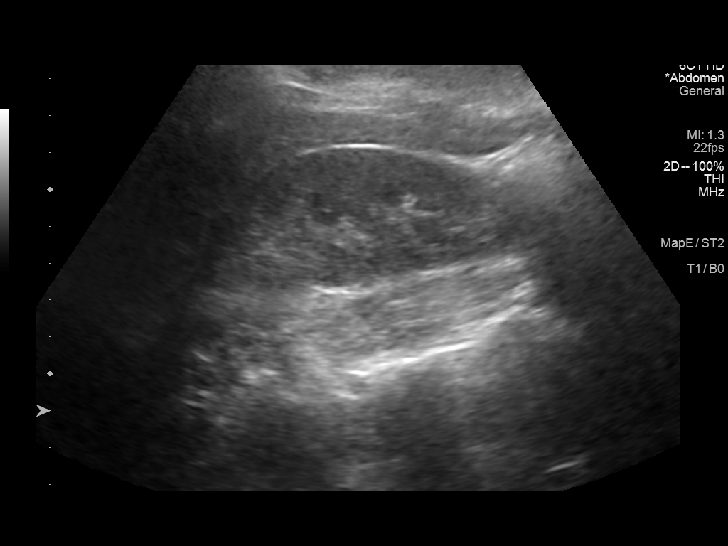
[im 65/83]
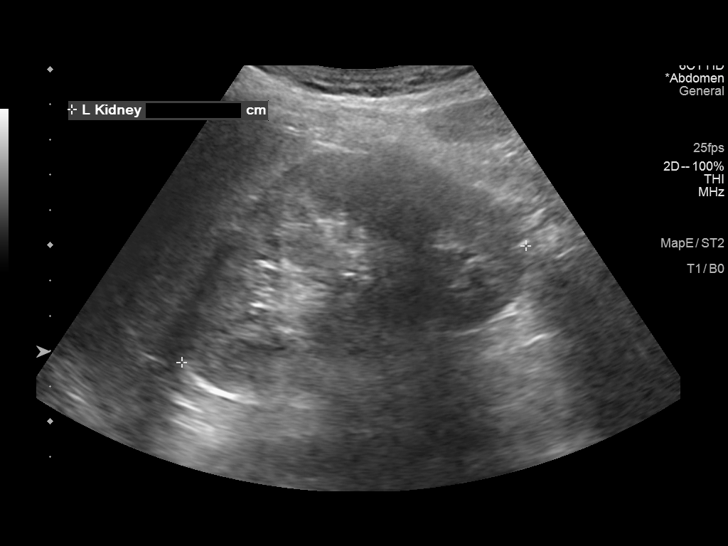
[im 72/83]
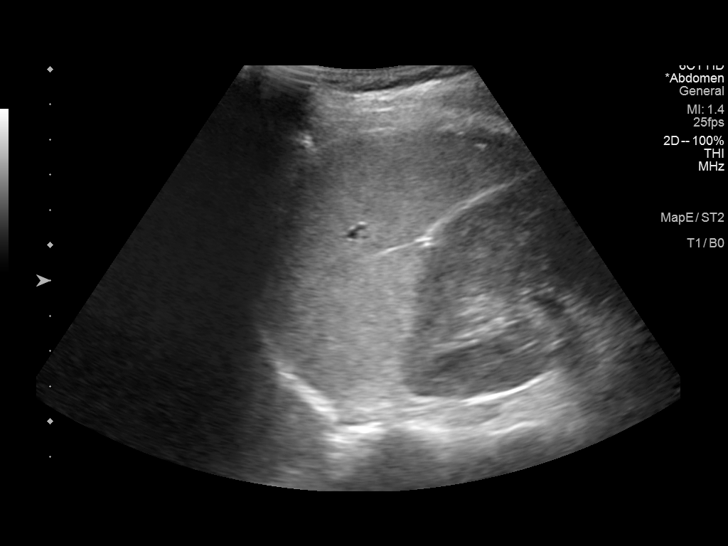
[im 79/83]
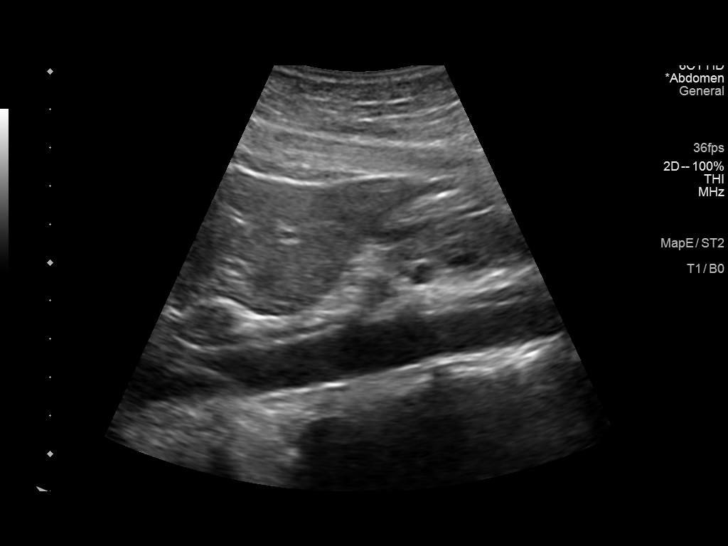

[Series 2001: us abdomen complete · 0.19mm/px · 1 of 2 slices shown (2 of 2)]
[im 1/2]
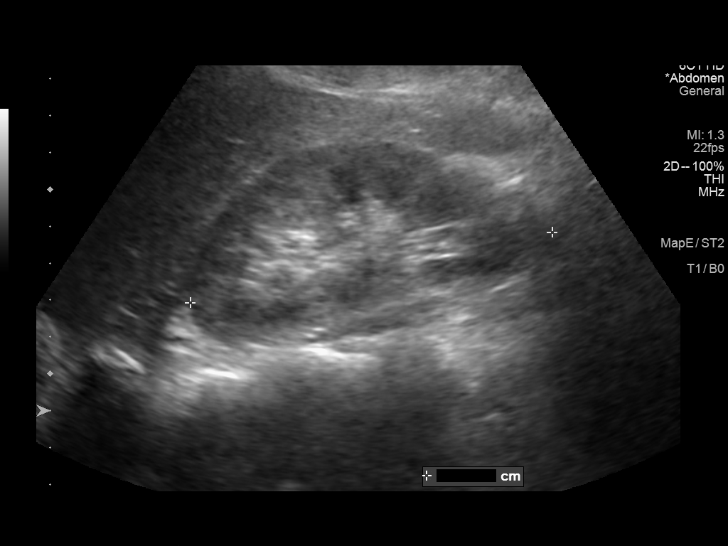

[13 of 25 positions shown; findings below may reference images not displayed]

FINDINGS: Gallbladder: The gallbladder wall is normal. No stones, sludge, or
pericholecystic fluid. No Murphy's sign. The gallbladder polyp
previously seen measures 8.9 mm today versus 6.7 mm in Thursday January, 2017
and 6.9 mm in Friday January, 2018.

Common bile duct: Diameter: 4.7 mm

Liver: Increased echogenicity with no focal mass. Portal vein is
patent on color Doppler imaging with normal direction of blood flow
towards the liver.

IVC: No abnormality visualized.

Pancreas: Visualized portion unremarkable.

Spleen: Size and appearance within normal limits.

Right Kidney: Length: 10 cm. Echogenicity within normal limits. No
mass or hydronephrosis visualized.

Left Kidney: Length: 10.3 cm. Echogenicity within normal limits. No
mass or hydronephrosis visualized.

Abdominal aorta: No aneurysm visualized.

Other findings: None.
IMPRESSION: 1. There is an 8.9 mm polyp in the gallbladder which measured 6.7 mm
in Thursday January, 2017. Visually, the polyp is larger as well. Recommend
surgical consultation with consideration of cholecystectomy. If the
gallbladder is not removed due to the growing polyp, recommend
follow-up ultrasound in 1 year.
2. Increased echogenicity in the liver with no focal mass, likely
due to hepatic steatosis.

These results will be called to the ordering clinician or
representative by the Radiologist Assistant, and communication
documented in the PACS or zVision Dashboard.
